# Patient Record
Sex: Female | Born: 1995
Health system: Southern US, Community
[De-identification: ages and names within clinical notes are randomized; demographics above are authoritative.]

## PROBLEM LIST (undated history)

## (undated) DIAGNOSIS — I10 Essential (primary) hypertension: Secondary | ICD-10-CM

## (undated) HISTORY — DX: Essential (primary) hypertension: I10

---
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
## 2018-11-11 IMAGING — CR DG FINGER THUMB 2+V*R*
3 series · 3 of 3 positions shown · non-contrast
Comparison: None.

CLINICAL DATA: Crush injury of the distal right thumb

EXAM:
RIGHT THUMB 2+V

[x finger pa right]
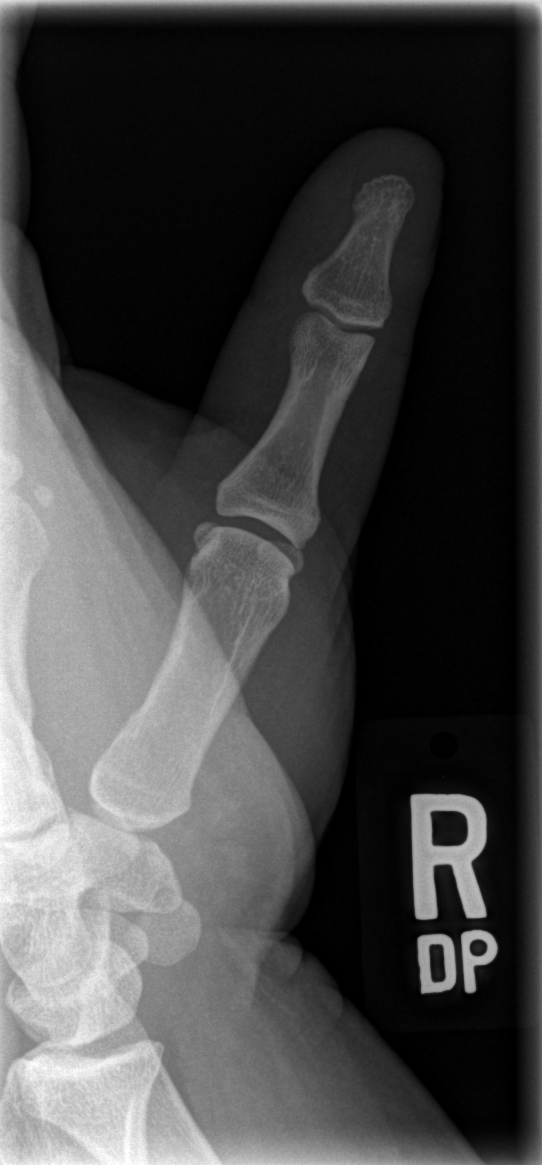

[x finger obl. right]
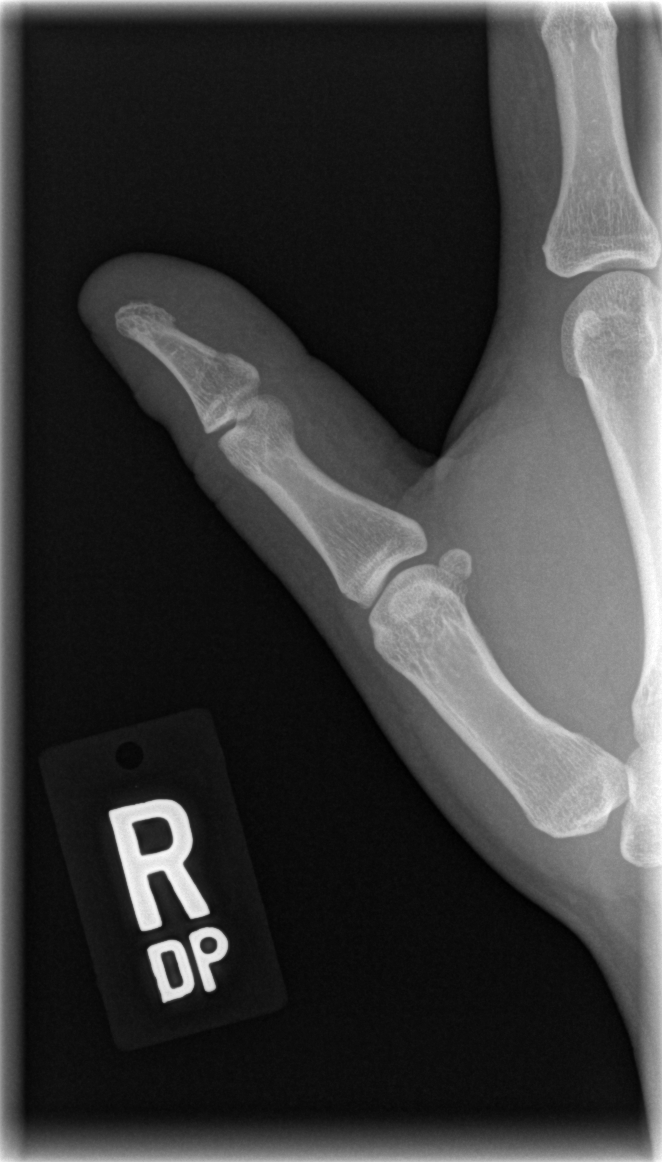

[x finger lateral right]
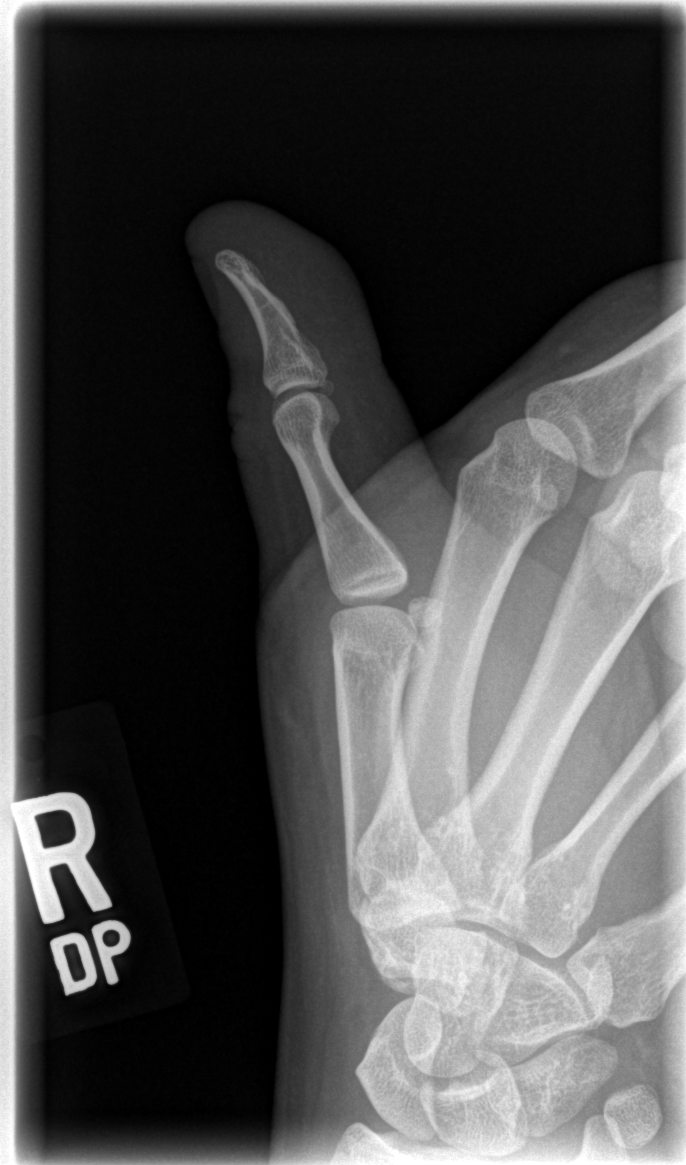

[3 of 3 positions shown; findings below may reference images not displayed]

FINDINGS: There is no evidence of fracture or dislocation. There is no
evidence of arthropathy or other focal bone abnormality. Soft
tissues are unremarkable
IMPRESSION: No acute osseous injury of the right thumb.

## 2022-04-03 ENCOUNTER — Ambulatory Visit (INDEPENDENT_AMBULATORY_CARE_PROVIDER_SITE_OTHER): Payer: BC Managed Care – PPO | Admitting: Obstetrics and Gynecology

## 2022-04-03 VITALS — BP 145/84 | HR 76 | Ht 67.0 in | Wt 336.5 lb

## 2022-04-03 DIAGNOSIS — R030 Elevated blood-pressure reading, without diagnosis of hypertension: Secondary | ICD-10-CM | POA: Diagnosis not present

## 2022-04-03 DIAGNOSIS — Z3401 Encounter for supervision of normal first pregnancy, first trimester: Secondary | ICD-10-CM

## 2022-04-03 DIAGNOSIS — N910 Primary amenorrhea: Secondary | ICD-10-CM | POA: Diagnosis not present

## 2022-04-03 DIAGNOSIS — Z6837 Body mass index (BMI) 37.0-37.9, adult: Secondary | ICD-10-CM

## 2022-04-03 DIAGNOSIS — Z3201 Encounter for pregnancy test, result positive: Secondary | ICD-10-CM | POA: Diagnosis not present

## 2022-04-03 DIAGNOSIS — Z3A01 Less than 8 weeks gestation of pregnancy: Secondary | ICD-10-CM

## 2022-04-03 DIAGNOSIS — N911 Secondary amenorrhea: Secondary | ICD-10-CM | POA: Diagnosis not present

## 2022-04-03 LAB — POCT PREGNANCY, URINE: Preg Test, Ur: POSITIVE — AB

## 2022-04-03 NOTE — Patient Instructions (Addendum)

## 2022-04-03 NOTE — Progress Notes (Signed)
   PRENATAL VISIT NOTE  Subjective:  Deborah [name] is a 26 y.o. G1 at [redacted]w[redacted]d by sure LMP 02/11/22 presenting for nurse visit for pregnancy test.   UPT positive. Pt present w/ her partner. They state they weren't actively trying for pregnancy but are accepting and doing well. During visit, pt had two elevated blood pressures. Asymptomatic. Denies ever being told she had a high blood pressure before.  Objective:   Vitals:   04/03/22 1338 04/03/22 1354  BP: (!) 157/97 (!) 145/84  Pulse: 72 76  Weight: 236 lb 8 oz (107.3 kg)   Height: 5\' 7"  (1.702 m)    General:  Alert, oriented and cooperative. Patient is in no acute distress.  Skin: Skin is warm and dry. No rash noted.   Cardiovascular: Normal heart rate noted  Respiratory: Normal respiratory effort, no problems with respiration noted   Assessment and Plan:  Pregnancy: G1P0 at [redacted]w[redacted]d 1. Supervision of low-risk first pregnancy, first trimester - CBC/D/Plt+RPR+Rh+ABO+RubIgG... - NOB intake scheduled 04/30/22  2. Elevated blood pressure reading - Comprehensive metabolic panel - Protein / creatinine ratio, urine - RTC in 1 week for BP check and to assess BP cuff size for home monitoring - Discussed w/ pt that if consistently >140/90, would recommend starting antihypertensive medications and closer monitoring during pregnancy  3. BMI 37.0-37.9, adult - Hemoglobin A1c  Future Appointments  Date Time Provider Department Center  04/10/2022  9:20 AM [facility] NURSE [facility]  04/30/2022  9:15 AM [facility] INTAKE [facility]    06/29/2022, MD

## 2022-04-03 NOTE — Progress Notes (Signed)
Pt here today for pregnancy test resulting positive.  Pt reports that her LMP 02/11/22, EDD 11/18/22, and 7w 2d today.  Pt denies vaginal bleeding and pain.  Pt desires to start prenatal care with [facility].  Pt presents with elevated BP.  Pt denies visual disturbances and headaches.

## 2022-04-04 ENCOUNTER — Encounter: Payer: Self-pay | Admitting: Obstetrics and Gynecology

## 2022-04-04 DIAGNOSIS — R7309 Other abnormal glucose: Secondary | ICD-10-CM | POA: Insufficient documentation

## 2022-04-04 DIAGNOSIS — R030 Elevated blood-pressure reading, without diagnosis of hypertension: Secondary | ICD-10-CM

## 2022-04-04 HISTORY — DX: Elevated blood-pressure reading, without diagnosis of hypertension: R03.0

## 2022-04-04 LAB — CBC/D/PLT+RPR+RH+ABO+RUBIGG...
Antibody Screen: NEGATIVE
Basophils Absolute: 0 10*3/uL (ref 0.0–0.2)
Basos: 0 %
EOS (ABSOLUTE): 0.1 10*3/uL (ref 0.0–0.4)
Eos: 1 %
HCV Ab: NONREACTIVE
HIV Screen 4th Generation wRfx: NONREACTIVE
Hematocrit: 42.9 % (ref 34.0–46.6)
Hemoglobin: 14.2 g/dL (ref 11.1–15.9)
Hepatitis B Surface Ag: NEGATIVE
Immature Grans (Abs): 0.1 10*3/uL (ref 0.0–0.1)
Immature Granulocytes: 1 %
Lymphocytes Absolute: 2.8 10*3/uL (ref 0.7–3.1)
Lymphs: 27 %
MCH: 27.8 pg (ref 26.6–33.0)
MCHC: 33.1 g/dL (ref 31.5–35.7)
MCV: 84 fL (ref 79–97)
Monocytes Absolute: 0.6 10*3/uL (ref 0.1–0.9)
Monocytes: 6 %
Neutrophils Absolute: 6.9 10*3/uL (ref 1.4–7.0)
Neutrophils: 65 %
Platelets: 420 10*3/uL (ref 150–450)
RBC: 5.11 x10E6/uL (ref 3.77–5.28)
RDW: 12.4 % (ref 11.7–15.4)
RPR Ser Ql: NONREACTIVE
Rh Factor: POSITIVE
Rubella Antibodies, IGG: 2.02 index (ref >0.99)
WBC: 10.5 10*3/uL (ref 3.4–10.8)

## 2022-04-04 LAB — COMPREHENSIVE METABOLIC PANEL
ALT: 22 IU/L (ref 0–32)
AST: 13 IU/L (ref 0–40)
Albumin/Globulin Ratio: 1.8 (ref 1.2–2.2)
Albumin: 4.6 g/dL (ref 4.0–5.0)
Alkaline Phosphatase: 120 IU/L (ref 44–121)
BUN/Creatinine Ratio: 13 (ref 9–23)
BUN: 9 mg/dL (ref 6–20)
Bilirubin Total: 0.2 mg/dL (ref 0.0–1.2)
CO2: 20 mmol/L (ref 20–29)
Calcium: 9.5 mg/dL (ref 8.7–10.2)
Chloride: 104 mmol/L (ref 96–106)
Creatinine, Ser: 0.7 mg/dL (ref 0.57–1.00)
Globulin, Total: 2.6 g/dL (ref 1.5–4.5)
Glucose: 130 mg/dL — ABNORMAL HIGH (ref 70–99)
Potassium: 3.9 mmol/L (ref 3.5–5.2)
Sodium: 140 mmol/L (ref 134–144)
Total Protein: 7.2 g/dL (ref 6.0–8.5)
eGFR: 124 mL/min/{1.73_m2} (ref >59)

## 2022-04-04 LAB — HEMOGLOBIN A1C
Est. average glucose Bld gHb Est-mCnc: 117 mg/dL
Hgb A1c MFr Bld: 5.7 % — ABNORMAL HIGH (ref 4.8–5.6)

## 2022-04-04 LAB — HCV INTERPRETATION

## 2022-04-05 LAB — PROTEIN / CREATININE RATIO, URINE
Creatinine, Urine: 192 mg/dL
Protein, Ur: 33.8 mg/dL
Protein/Creat Ratio: 176 mg/g creat (ref 0–200)

## 2022-04-07 ENCOUNTER — Encounter: Payer: Self-pay | Admitting: *Deleted

## 2022-04-10 ENCOUNTER — Ambulatory Visit (INDEPENDENT_AMBULATORY_CARE_PROVIDER_SITE_OTHER): Payer: BC Managed Care – PPO

## 2022-04-10 VITALS — BP 143/97 | HR 68 | Ht 67.0 in | Wt 324.1 lb

## 2022-04-10 DIAGNOSIS — Z0130 Encounter for examination of blood pressure without abnormal findings: Secondary | ICD-10-CM | POA: Diagnosis not present

## 2022-04-10 MED ORDER — NIFEDIPINE ER OSMOTIC RELEASE 30 MG PO TB24: 30.0000 mg | ORAL_TABLET | Freq: Every day | ORAL | 1 refills | Status: DC

## 2022-04-10 NOTE — Progress Notes (Signed)
Pt here today for BP check s/p elevated BP at nurse visit on 04/03/22.  Pt denies headache and changes in vision.  BP LA 141/97  Rpt RA 143/97.  Reviewed chart with Dr. Shawnie Pons who recommends that pt starts taking Procardia 30 mg tablet daily.  Pt advised to continue to monitor for sx's of elevated BP, that we will f/u with her at her appt at 04/30/22, and to reach out to the office with questions/concerns.  Procardia e-prescribed.  Pt verbalized understanding with no further questions.  Addison Naegeli, RN  04/10/22

## 2022-04-23 ENCOUNTER — Other Ambulatory Visit: Payer: Self-pay

## 2022-04-23 DIAGNOSIS — Z3401 Encounter for supervision of normal first pregnancy, first trimester: Secondary | ICD-10-CM

## 2022-04-28 DIAGNOSIS — R7303 Prediabetes: Secondary | ICD-10-CM

## 2022-04-28 HISTORY — DX: Prediabetes: R73.03

## 2022-04-28 NOTE — L&D Delivery Note (Signed)
Delivery Note 27 y.o. G1P0 at [redacted]w[redacted]d admitted for IOL due to cHTN, LGA, A1GDM.  At 1:01 PM a viable female was delivered via Vaginal, Spontaneous (Presentation: Left Occiput Anterior).  APGAR: 7, 9; weight  TBD.   Placenta status: Spontaneous, Intact.  Cord: 3 vessels with the following complications: None.  Cord pH: not collected  Anesthesia: Epidural Episiotomy: None Lacerations: None Suture Repair: 3-0 monocryl figure of 8 applied to vaginal abrasion to achieve hemostasis Est. Blood Loss (mL): 193  Mom to postpartum.  Baby to Couplet care / Skin to Skin.  Sheppard Evens MD MPH OB Fellow, Faculty Practice [facility], Center for [facility] 11/16/2022

## 2022-04-30 ENCOUNTER — Other Ambulatory Visit: Payer: Self-pay

## 2022-04-30 ENCOUNTER — Other Ambulatory Visit: Payer: Medicaid Other

## 2022-04-30 ENCOUNTER — Ambulatory Visit (INDEPENDENT_AMBULATORY_CARE_PROVIDER_SITE_OTHER): Payer: BC Managed Care – PPO

## 2022-04-30 VITALS — BP 134/85 | HR 79 | Wt 324.3 lb

## 2022-04-30 DIAGNOSIS — O0990 Supervision of high risk pregnancy, unspecified, unspecified trimester: Secondary | ICD-10-CM | POA: Insufficient documentation

## 2022-04-30 DIAGNOSIS — O10919 Unspecified pre-existing hypertension complicating pregnancy, unspecified trimester: Secondary | ICD-10-CM | POA: Insufficient documentation

## 2022-04-30 DIAGNOSIS — R7309 Other abnormal glucose: Secondary | ICD-10-CM

## 2022-04-30 DIAGNOSIS — Z3480 Encounter for supervision of other normal pregnancy, unspecified trimester: Secondary | ICD-10-CM

## 2022-04-30 DIAGNOSIS — I10 Essential (primary) hypertension: Secondary | ICD-10-CM

## 2022-04-30 DIAGNOSIS — Z3401 Encounter for supervision of normal first pregnancy, first trimester: Secondary | ICD-10-CM

## 2022-04-30 NOTE — Progress Notes (Signed)
Patient was assessed and managed by nursing staff during this encounter. I have reviewed the chart and agree with the documentation and plan. I have also made any necessary editorial changes.  Griffin Basil, MD 04/30/2022 1:12 PM

## 2022-04-30 NOTE — Addendum Note (Signed)
Addended by: Bethanne Ginger on: 04/30/2022 02:57 PM   Modules accepted: Orders

## 2022-04-30 NOTE — Patient Instructions (Signed)
AREA PEDIATRIC/FAMILY PRACTICE PHYSICIANS  [place] ([address]) [facility] Chambliss, MD; Eniola, MD; Hale, MD; Hensel, MD; McDiarmid, MD; McIntyer, MD; Berklee Battey, MD; Walden, MD [address] [place], [place] [address] [phone] Mon-Fri 8:30-12:30, 1:30-5:00 Providers come to see babies at [facility] Accepting Medicaid [facility] providers who accept newborns: Koirala, MD; Morrow, MD; Wolters, MD [address], [place], [place] [address] [phone] Mon-Fri 8:00-5:30 Babies seen by providers at [facility] Does NOT accept Medicaid Please call early in hospitalization for appointment (limited availability)  [facility] Mulberry, MD [address] [place], [place] [address] [phone] Mon, Tue, Thur, Fri 8:30-5:00, Wed 10:00-7:00 (closed 1-2pm) Babies seen by [facility] providers Accepting Medicaid Rubin - Pediatrician Rubin, MD [address], [place], [place] [address] [phone] Mon-Fri 8:30-5:00, Sat 8:30-12:00 Provider comes to see babies at [facility] Accepting Medicaid Must have been referred from current patients or contacted office prior to delivery [facility] ([facility]) Brown, MD; Chandler, MD; Ettefagh, MD; Grant, MD; Lester, MD; McCormick, MD; McQueen, MD; Prose, MD; Simha, MD; Stanley, MD; Stryffeler, NP; Tebben, NP [address], [place], [place] [address] [phone] Mon, Tue, Thur, Fri 8:30-5:30, Wed 9:30-5:30, Sat 8:30-12:30 Babies seen by [facility] providers Accepting Medicaid Only accepting infants of first-time parents or siblings of current patients Hospital discharge coordinator will make follow-up appointment Jack Amos [address], [place], [place]  [address] [phone]   Fax - [phone] [facility] [address], [place], [place]  [address] Phone - [phone]   Fax - [phone] Shilpa Gosrani [address], [place], [place]  [address] [phone]  [place] ([address]) [facility] Bates, MD; Brassfield, MD; Cooper, Cox, MD; MD; Davis, MD; Dovico, MD; Ettefaugh, MD; Little, MD; Lowe, MD; Keiffer, MD; Melvin, MD; Sumner, MD; Williams, MD [address], [place], [place] [address] [phone] Mon-Fri 8:30-5:00 (extended evenings Mon-Thur as needed), Sat-Sun 10:00-1:00 Providers come to see babies at [facility] Accepting Medicaid for families of first-time babies and families with all children in the household age 3 and under. Must register with office prior to making appointment (M-F only). [facility] Henson, NP; Knapp, MD; Lalonde, MD; Tysinger, PA [address] [place], [place] [address] [phone] Mon-Fri 8:00-5:00 Babies seen by providers at [facility] Does NOT accept Medicaid/Commercial Insurance Only [facility] ([facility])  Artis, MD; Barnes, MD; Bratton, MD; Coccaro, MD; Lockett Gardner, MD; Kramer, MD; Marshall, MD; Netherton, MD; Poleto, MD; Skinner, MD [address] [place], [place] [address] [phone] Mon-Fri 8:30-5:30, Sat (Oct.-Mar.) 9:00-1:00 Babies seen by providers at [facility] Accepting Medicaid  [place] ([address]) [facility] Reid, MD; Warner, MD [address], [place],  [address] [phone] Mon-Fri 8:30-5:00, Sat 8:30-12:00 Providers come to see babies at [facility] Does NOT accept Medicaid [facility] Becker, PA; Hagler, MD; Scifres, PA; Sun, MD; Swayne, MD [address], ,  [address] [phone] Mon-Fri 8:00-5:00 Babies seen by providers at [facility] Does NOT accept Medicaid Only accepting babies of parents who  are patients Please call early in hospitalization for appointment (limited availability)  Pediatricians Clark, MD; Frye, MD; Kelleher, MD; Mack, NP; Miller, MD; O'Keller, MD; [place], NP; Pudlo, MD; Puzio, MD; Thomas, MD; Tucker, MD; Twiselton, MD [address], [place], [place] [address] [phone] Mon-Fri 8:00-5:00, Sat 9:00-12:00 Providers come to see babies at [facility] Does NOT accept Medicaid  [place] ([address]) [facility] providers accepting new patients: Brake, NP; Wharton, PA [address], [place], [place] [address] [phone] Mon-Fri 8:00-5:00 Babies seen by providers at [facility] Does NOT accept Medicaid Only accepting babies of parents who are patients Please call early in hospitalization for appointment (limited availability) [facility] Gay, MD; Quinlan, MD [address] [place], [place] [address] [phone] (press [phone] to schedule appointment) Mon-Fri 8:00-5:00 Providers come to see babies at [facility] Does NOT accept Medicaid [facility] Mazer, MD [address] [place], [place] [address] [phone] Mon-Fri 8:30-5:00 (lunch 12:30-1:00), extended hours by appointment only Wed 5:00-6:30 Babies seen by [facility] providers Accepting Medicaid [facility] Banks, MD; Jordan, MD; Koberlein, MD [address], [place], [place] [address] [phone] Mon-Fri 8:00-5:00 Babies seen by [facility] providers Does NOT accept Medicaid [facility] Parker, MD; Hunter, MD; Wallace, DO [address] [place], [place] [address] [phone] Mon-Fri 8:00-5:00 Babies seen by [facility] providers Does NOT accept Medicaid [facility] Brandon, PA; Brecken, PA; Christy, NP; Dees, MD; DeClaire, MD; DeWeese, MD; Hansen, NP; Mills, NP; Parrish, NP; Smoot, NP; Summer, MD; Vapne,  MD [address] [place], [place] [address] [phone] Mon-Fri 8:30-5:00, Sat 10:00-1:00 Providers come to see babies at [facility] Does NOT accept Medicaid Free prenatal information session Tuesdays at 4:45pm [facility] Bouska, MD; Gordon, PA; Jeffery, PA; Weber, PA [address] [place] [address] [phone] Mon-Fri 7:30-5:30 Babies seen by [facility] providers [facility] [address], [place], [place]  [address] [phone]   Fax - [phone]  [place] ([address] & [address]) [facility] [place] [address] [place], [place] [address] [phone] Mon-Thur 8:00-6:00 Providers come to see babies at [facility] Accepting Medicaid [facility] Anderson, NP; Badger, MD; Beal, PA; Spencer, PA [address] [place], [place] [address] [phone] Mon-Thur 7:30-7:30, Fri 7:30-4:30 Babies seen by [facility] providers Accepting Medicaid [facility] Agbuya, MD; Klett, NP; Romgoolam, MD [address], [place], [place] [address] [phone] Mon-Fri 8:30-5:00, Sat 8:30-12:00 Providers come to see babies at [facility] Accepting Medicaid Must have "Meet & Greet" appointment at office prior to delivery [facility] ([facility] [place]) McCord, MD; Wallace, MD; Wood, MD [address], [place], [place] [address] [phone] Mon-Wed 8:00-6:00, Thur-Fri 8:00-5:00, Sat 9:00-12:00 Providers come to see babies at [facility] Does NOT accept Medicaid Only accepting siblings of current patients [facility]  [address], [place], [place]  [address] [phone]   Fax - [phone] [facility] [address], [place], [place]  [address] [phone]   Fax -  [phone]  [place] ([address] & [address]) [facility] Cirigliano, DO; Matthews, DO [address] [place], [place] [address] [phone] Mon-Fri 7:00-5:00 Babies seen by [facility] providers Does NOT accept Medicaid [facility] Briscoe, MD; Howley, PA; Moreira, PA [address], [place], [place] [address] [phone] Mon-Fri 8:00-5:00 Babies seen by [facility] providers Accepting Medicaid [facility] Boyd, MD; Church, PA; Jones, NP; Osborn, PA [address], , [place] [address] [phone] Mon-Fri 8:00-5:00 Babies seen by providers at [facility] Accepting Medicaid  [place] ([address]) [facility] [place] Wendling, DO [address] [place], [place] [address] [phone] Mon-Fri 8:00-5:00 Babies seen by [facility] providers Does NOT accept Medicaid Limited availability, please call early in hospitalization to schedule follow-up [facility] Calderon, PA; Cummings, MD; Dillard, MD; Martin, PA; Olson, MD; VanDeven, PA [address], [place], [place] [address] [phone] Mon-Fri 8:30-5:00, Sat 9:00-12:00 Babies seen by providers at [facility] Accepting Medicaid Please register online then schedule online or call office [email or web address] Hunter, NP; Kumar, MD; Martin Rogers, PA [address], [place], [place] [address] [phone] Mon-Fri 8:00-5:00 Babies seen by providers at [facility] Accepting Medicaid [facility] ([facility]) Jay, MD; Kristi Fleenor, NP; West, MD [address], [place], [place] [address] [phone] Mon-Fri 8:00-5:30, Sat&Sun by appointment (phones open at  8:30) Babies seen by [facility] providers Accepting Medicaid Must be a first-time baby or sibling of current patient [facility] [place]  [address], [place], [place]  [address] [phone]   Fax - [phone]  [place] ([address]) [facility] Brown, PA; Cowen, PA; Rice, MD; Helton, PA; Spry, MD [address] [place], [place] [address] [phone] Mon-Thur 8:00-7:00, Fri 8:00-5:00, Sat 8:00-12:00, Sun 9:00-12:00 Babies seen by [facility] providers Accepting Medicaid [facility] Coe-Goins, MD; Marshall, MD; Pierre-Louis, MD [address], [place], [place] [address] [phone] Mon-Thur 8:00-5:00 Babies seen by providers at [facility] Accepting Medicaid [facility] Bratton, MD; Coe-Goins, MD; Hayes, MD; Lewis, MD; List, MD; Lott, MD; Marshall, MD; Moran, MD; O'Adleigh Mcmasters, MD; Pierre-Louis, MD; Pitonzo, MD; Scholer, MD; Spangle, MD [address] [place], [place] [address] [phone] Mon-Fri 8:00-5:30, Sat (Oct.-Mar.) 9:00-1:00 Babies seen by providers at [facility] Accepting Medicaid Must fill out new patient packet, available online at [email or web address] [facility] ([facility]) Friddle, NP; Harris, NP; Kelly, NP; Logan, MD; Melvin, PA; Poth, MD; Ramadoss, MD; Stanton, NP [address], [place], [place] [address] [phone] Mon-Thur 8:00-5:30, Fri 8:00-5:00 Babies seen by providers at [facility] Accepting Medicaid  [place] ([address]) [facility] Dixon, PA; Plumwood, MD; Pickard, MD; Tapia, PA [address], [place], [place] [address] [phone] Mon-Fri 8:00-5:00 Babies seen by providers at [facility] Accepting Medicaid   [place] ([address]) [facility] [place] Masneri, DO; Meyers, MD; Nelson, PA [address], [place], [place] [address] [phone] Mon-Fri 8:00-5:00 Babies seen by providers at [facility] Does NOT accept Medicaid Limited appointment availability, please call early in hospitalization  [facility] [place] Kunedd, DO; McGowen, MD [address], [place], [place] [address] [phone] Mon-Fri 8:00-5:00 Babies seen by [facility] providers Does NOT accept Medicaid [facility] [place] Cameron, MD; MacDonald, MD; Michaels, PA; Nayak, MD [address] [place] [address], [place], [place] [address] [phone] Mon-Fri 8:00-5:00 After hours clinic ([address] [place],  [address]) [phone] Mon-Fri 5:00-8:00, Sat 12:00-6:00, Sun 10:00-4:00 Babies seen by [facility] providers Accepting Medicaid [facility] [place] [address], [place], [place]  [address] [phone]   Fax - [phone]  [place] ([address]) [facility] [place] [facility] Andy, MD [address], [place], [place] [address] [phone] Mon-Fri 8:00-5:00 Babies seen by [facility] providers Does NOT accept Medicaid [facility] [place] ([facility] [place]) Eksir, MD [address], [place], [place] [address] [phone] Mon-Thur 8:00-7:00, Fri 8:00-5:00, Sat 8:00-12:00 Babies seen by providers at [facility] Accepting Medicaid - but does not have vaccinations in office (must be received elsewhere) Limited availability, please call early in hospitalization  [place] ([address]) [facility]  Charlene Flemming, MD [address], [place] [address] [phone]  Fax [phone]  [facility]  Kimberly Newton, MD, Annamarie Streilein, PA, Carla Hampton, PA [address] [place], [place]  [address] [phone] [facility]  [address], [place], [place] [address] [phone] [address], [place], [place] [address] [phone] (West Office)  [facility] [address], [place], [place] [address] [phone] [facility] [address] [place], [place], [place] [address] [phone] [facility] [address], [place], [place] [address] [phone] [facility] [address], [place], [place] [address] [phone] [facility] [address], [place], [place] [address] [phone] [facility] [address], [place], [place] [address] [phone] [facility]  [address], [place], [place] [address] [phone] Dr. [facility] Little [address] [place] [address], , [place] [address] [phone] [facility] [address], [place], [place] [address] [phone] [facility] [address], ,  [address] [phone]  

## 2022-04-30 NOTE — Progress Notes (Signed)
New OB Intake  I connected withNAME@ on 04/30/22 at  9:15 AM EST by In Person Visit and verified that I am speaking with the correct person using two identifiers. Nurse is located at [facility] and pt is located at [facility].  I discussed the limitations, risks, security and privacy concerns of performing an evaluation and management service by telephone and the availability of in person appointments. I also discussed with the patient that there may be a patient responsible charge related to this service. The patient expressed understanding and agreed to proceed.  I explained I am completing New OB Intake today. We discussed EDD of 11/18/22 that is based on LMP of 02/11/22. Pt is G1/P0. I reviewed her allergies, medications, Medical/Surgical/OB history, and appropriate screenings. I informed her of [facility] services. [facility] information placed in AVS. Based on history, this is a high risk pregnancy.  Patient Active Problem List   Diagnosis Date Noted   Elevated blood pressure reading - suspected cHTN 04/04/2022   Elevated hemoglobin A1c 04/04/2022    Concerns addressed today  Delivery Plans Plans [facility] at [facility]. Patient given information for [facility] Healthy Baby website for more information about Women[facility] and [place]. Patient is not interested in water birth. Offered upcoming OB visit with CNM to discuss further.  MyChart/Babyscripts MyChart access verified. I explained pt will have some visits in office and some virtually. Babyscripts instructions given and order placed. Patient verifies receipt of registration text/e-mail. Account successfully created and app downloaded.  Blood Pressure Cuff/Weight Scale Pt has her own BP Cuff, Explained after first prenatal appt pt will check weekly and document in 60. Patient does have weight scale.  Anatomy US Explained first scheduled [place] will be around 19 weeks. Anatomy US scheduled for 06/24/22 at 0900. Pt notified to arrive at  [place].  Labs Discussed Johnsie Cancel genetic screening with patient. Would like both Panorama and Horizon drawn at new OB visit. Routine prenatal labs needed.  COVID Vaccine Patient has had COVID vaccine.   Is patient a CenteringPregnancy candidate?  Not a Candidate Declined due to  NA Not a candidate due to [facility], medication controlled If accepted,    Is patient a Mom+Baby Combined Care candidate?  Not a candidate   If accepted, Mom+Baby staff notified  Social Determinants of Health Food Insecurity: Patient denies food insecurity. WIC Referral: Patient is interested in referral to [facility].  Transportation: Patient denies transportation needs. Childcare: Discussed no children allowed at ultrasound appointments. Offered childcare services; patient declines childcare services at this time.  First visit review I reviewed new OB appt with patient. I explained they will have a provider visit that includes . Explained pt will be seen by Dr. Elgie Congo at first visit; encounter routed to appropriate provider. Explained that patient will be seen by pregnancy navigator following visit with provider.   Bethanne Ginger, CMA 04/30/2022  9:30 AM

## 2022-05-01 LAB — COMPREHENSIVE METABOLIC PANEL
ALT: 17 IU/L (ref 0–32)
AST: 9 IU/L (ref 0–40)
Albumin/Globulin Ratio: 1.9 (ref 1.2–2.2)
Albumin: 4.3 g/dL (ref 4.0–5.0)
Alkaline Phosphatase: 94 IU/L (ref 44–121)
BUN/Creatinine Ratio: 11 (ref 9–23)
BUN: 8 mg/dL (ref 6–20)
Bilirubin Total: <0.2 mg/dL (ref 0.0–1.2)
CO2: 20 mmol/L (ref 20–29)
Calcium: 9.6 mg/dL (ref 8.7–10.2)
Chloride: 102 mmol/L (ref 96–106)
Creatinine, Ser: 0.73 mg/dL (ref 0.57–1.00)
Globulin, Total: 2.3 g/dL (ref 1.5–4.5)
Glucose: 124 mg/dL — ABNORMAL HIGH (ref 70–99)
Potassium: 4.4 mmol/L (ref 3.5–5.2)
Sodium: 138 mmol/L (ref 134–144)
Total Protein: 6.6 g/dL (ref 6.0–8.5)
eGFR: 118 mL/min/{1.73_m2} (ref >59)

## 2022-05-01 LAB — GLUCOSE TOLERANCE, 2 HOURS W/ 1HR
Glucose, 1 hour: 180 mg/dL — ABNORMAL HIGH (ref 70–179)
Glucose, 2 hour: 120 mg/dL (ref 70–152)
Glucose, Fasting: 100 mg/dL — ABNORMAL HIGH (ref 70–91)

## 2022-05-01 LAB — TSH: TSH: 1.23 u[IU]/mL (ref 0.450–4.500)

## 2022-05-02 LAB — CULTURE, OB URINE

## 2022-05-02 LAB — URINE CULTURE, OB REFLEX

## 2022-05-02 MED ORDER — GLUCOSE BLOOD VI STRP: ORAL_STRIP | 12 refills | Status: DC

## 2022-05-02 MED ORDER — ACCU-CHEK SOFTCLIX LANCETS MISC: 12 refills | Status: DC

## 2022-05-02 MED ORDER — FREESTYLE LITE DEVI: 1.0000 | Freq: Once | 0 refills | Status: AC

## 2022-05-07 ENCOUNTER — Encounter: Payer: BC Managed Care – PPO | Admitting: Obstetrics and Gynecology

## 2022-05-08 ENCOUNTER — Telehealth: Payer: Self-pay

## 2022-05-08 ENCOUNTER — Other Ambulatory Visit: Payer: Self-pay

## 2022-05-08 DIAGNOSIS — R7309 Other abnormal glucose: Secondary | ICD-10-CM

## 2022-05-08 LAB — HORIZON CUSTOM: REPORT SUMMARY: NEGATIVE

## 2022-05-08 NOTE — Telephone Encounter (Signed)
[place], Babyscripts  P [facility]-Bab[facility]cripts Htn Md; P [facility] Clinical Pool Babyscripts Trigger Notification for [name], Deborah Trigger Severity: Elevated Blood Pressure Value: 149/94 Reading Date: 2022-05-07 Symptoms: No Symptoms  Called pt to follow up on Babyscripts alert. VM left. MyChart message sent.

## 2022-05-09 LAB — PANORAMA PRENATAL TEST FULL PANEL:PANORAMA TEST PLUS 5 ADDITIONAL MICRODELETIONS: FETAL FRACTION: 2.9

## 2022-05-13 ENCOUNTER — Encounter: Payer: BC Managed Care – PPO | Attending: Obstetrics and Gynecology | Admitting: Registered"

## 2022-05-13 ENCOUNTER — Other Ambulatory Visit: Payer: Self-pay

## 2022-05-13 ENCOUNTER — Ambulatory Visit (INDEPENDENT_AMBULATORY_CARE_PROVIDER_SITE_OTHER): Payer: BC Managed Care – PPO | Admitting: Registered"

## 2022-05-13 DIAGNOSIS — Z713 Dietary counseling and surveillance: Secondary | ICD-10-CM | POA: Insufficient documentation

## 2022-05-13 DIAGNOSIS — R7309 Other abnormal glucose: Secondary | ICD-10-CM

## 2022-05-13 NOTE — Progress Notes (Signed)
Patient was seen for elevated A1c (in pregnancy) on 05/13/22  Start time 1120 and End time 1215   Estimated due date: 11/18/22; [redacted]w[redacted]d Patient receives Ob care at [facility]  Clinical: Medications: reviewed Medical History: elevated A1c Labs: OGTT 100(H)-180(H)-120, A1c 5.7%   Dietary and Lifestyle History: Pt states she usually doesn't eat breakfast, but trying to for the pregnancy. Pt states she usually eats most meats, but during pregnancy has aversion to red meat and pork.   Physical Activity: not assessed Stress: not assessed Sleep: not assessed  24 hr Recall: not assessed  NUTRITION INTERVENTION  Nutrition education (E-1) on the following topics:   Initial Follow-up  [x]  []  Definition of Gestational Diabetes [x]  []  Why dietary management is important in controlling blood glucose [x]  []  Effects each nutrient has on blood glucose levels [x]  []  Simple carbohydrates vs complex carbohydrates [x]  []  Fluid intake [x]  []  Creating a balanced meal plan [x]  []  Carbohydrate counting  [x]  []  When to check blood glucose levels [x]  []  Proper blood glucose monitoring techniques [x]  []  Effect of stress and stress reduction techniques  [x]  []  Exercise effect on blood glucose levels, appropriate exercise during pregnancy [x]  []  Importance of limiting caffeine and abstaining from alcohol and smoking [x]  []  Medications used for blood sugar control during pregnancy [x]  []  Hypoglycemia and rule of 15 [x]  []  Postpartum self care  Patient has a meter prior to visit.   Patient instructed to monitor glucose levels: FBS: 60 - ? 95 mg/dL, 2 hour: ? 120 mg/dL  Patient received handouts: Nutrition Diabetes and Pregnancy Carbohydrate Counting List  Patient will be seen for follow-up as needed.

## 2022-05-19 ENCOUNTER — Other Ambulatory Visit: Payer: Self-pay | Admitting: Obstetrics and Gynecology

## 2022-05-19 ENCOUNTER — Other Ambulatory Visit: Payer: Self-pay

## 2022-05-19 ENCOUNTER — Ambulatory Visit (INDEPENDENT_AMBULATORY_CARE_PROVIDER_SITE_OTHER): Payer: BC Managed Care – PPO | Admitting: Obstetrics and Gynecology

## 2022-05-19 ENCOUNTER — Other Ambulatory Visit (HOSPITAL_COMMUNITY)
Admission: RE | Admit: 2022-05-19 | Discharge: 2022-05-19 | Disposition: A | Discharge status: Home / Self Care | Payer: BC Managed Care – PPO | Source: Ambulatory Visit | Attending: Obstetrics and Gynecology | Admitting: Obstetrics and Gynecology

## 2022-05-19 ENCOUNTER — Encounter: Payer: Self-pay | Admitting: Obstetrics and Gynecology

## 2022-05-19 VITALS — BP 137/87 | HR 82 | Wt 321.2 lb

## 2022-05-19 DIAGNOSIS — O0990 Supervision of high risk pregnancy, unspecified, unspecified trimester: Secondary | ICD-10-CM | POA: Insufficient documentation

## 2022-05-19 DIAGNOSIS — R7309 Other abnormal glucose: Secondary | ICD-10-CM

## 2022-05-19 DIAGNOSIS — I10 Essential (primary) hypertension: Secondary | ICD-10-CM | POA: Diagnosis not present

## 2022-05-19 DIAGNOSIS — Z3A13 13 weeks gestation of pregnancy: Secondary | ICD-10-CM

## 2022-05-19 DIAGNOSIS — O24410 Gestational diabetes mellitus in pregnancy, diet controlled: Secondary | ICD-10-CM | POA: Insufficient documentation

## 2022-05-19 DIAGNOSIS — O0991 Supervision of high risk pregnancy, unspecified, first trimester: Secondary | ICD-10-CM | POA: Diagnosis not present

## 2022-05-19 DIAGNOSIS — Z23 Encounter for immunization: Secondary | ICD-10-CM

## 2022-05-19 DIAGNOSIS — Z6841 Body Mass Index (BMI) 40.0 and over, adult: Secondary | ICD-10-CM

## 2022-05-19 DIAGNOSIS — O24419 Gestational diabetes mellitus in pregnancy, unspecified control: Secondary | ICD-10-CM | POA: Insufficient documentation

## 2022-05-19 DIAGNOSIS — R030 Elevated blood-pressure reading, without diagnosis of hypertension: Secondary | ICD-10-CM

## 2022-05-19 HISTORY — DX: Gestational diabetes mellitus in pregnancy, diet controlled: O24.410

## 2022-05-19 MED ORDER — NIFEDIPINE ER 60 MG PO TB24: 60.0000 mg | ORAL_TABLET | Freq: Every day | ORAL | 4 refills | Status: DC

## 2022-05-19 NOTE — Progress Notes (Signed)
INITIAL PRENATAL VISIT NOTE  Subjective:  Deborah [name] is a 27 y.o. G1P0 at [redacted]w[redacted]d by LMP being seen today for her initial prenatal visit. She has an obstetric history significant for nulliparity. She has a medical history significant for maternal obesity.  Patient reports no complaints.  Contractions: Irritability. Vag. Bleeding: None.  Movement: Absent. Denies leaking of fluid.    Past Medical History:  Diagnosis Date   Hypertension    Pre-diabetes 2024    Past Surgical History:  Procedure Laterality Date   MANDIBLE SURGERY Right 2018    OB History  Gravida Para Term Preterm AB Living  1            SAB IAB Ectopic Multiple Live Births               # Outcome Date GA Lbr Len/2nd Weight Sex Delivery Anes PTL Lv  1 Current             Social History   Socioeconomic History   Marital status: Single    Spouse name: Not on file   Number of children: Not on file   Years of education: Not on file   Highest education level: Not on file  Occupational History   Not on file  Tobacco Use   Smoking status: Never    Passive exposure: Never   Smokeless tobacco: Never  Vaping Use   Vaping Use: Never used  Substance and Sexual Activity   Alcohol use: Not Currently   Drug use: Never   Sexual activity: Yes    Birth control/protection: None  Other Topics Concern   Not on file  Social History Narrative   Not on file   Social Determinants of Health   Financial Resource Strain: Not on file  Food Insecurity: No Food Insecurity (05/19/2022)   Hunger Vital Sign    Worried About Running Out of Food in the Last Year: Never true    Ran Out of Food in the Last Year: Never true  Transportation Needs: No Transportation Needs (05/19/2022)   PRAPARE - Hydrologist (Medical): No    Lack of Transportation (Non-Medical): No  Physical Activity: Not on file  Stress: Not on file  Social Connections: Not on file    History reviewed. No pertinent  family history.   Current Outpatient Medications:    Accu-Chek Softclix Lancets lancets, Use as instructed. QID, Disp: 100 each, Rfl: 12   Blood Glucose Monitoring Suppl (ACCU-CHEK GUIDE ME) w/Device KIT, USE AS DIRECTED 4 TIMES A DAY, Disp: , Rfl:    glucose blood test strip, Use as instructed. QID, Disp: 100 each, Rfl: 12   NIFEdipine (PROCARDIA XL) 30 MG 24 hr tablet, Take 1 tablet (30 mg total) by mouth daily., Disp: 30 tablet, Rfl: 1   prenatal vitamin w/FE, FA (PRENATAL 1 + 1) 27-1 MG TABS tablet, Take 1 tablet by mouth daily at 12 noon., Disp: , Rfl:   No Known Allergies  Review of Systems: Negative except for what is mentioned in HPI.  Objective:   Vitals:   05/19/22 0936 05/19/22 0950  BP: (!) 138/90 137/87  Pulse: 79 82  Weight: (!) 321 lb 3.2 oz (145.7 kg)     Fetal Status: Fetal Heart Rate (bpm): 171   Movement: Absent     Physical Exam: BP 137/87   Pulse 82   Wt (!) 321 lb 3.2 oz (145.7 kg)   LMP 02/11/2022   BMI 50.31  kg/m  CONSTITUTIONAL: Well-developed, well-nourished female in no acute distress.  NEUROLOGIC: Alert and oriented to person, place, and time. Normal reflexes, muscle tone coordination. No cranial nerve deficit noted. PSYCHIATRIC: Normal mood and affect. Normal behavior. Normal judgment and thought content. SKIN: Skin is warm and dry. No rash noted. Not diaphoretic. No erythema. No pallor. HENT:  Normocephalic, atraumatic, External right and left ear normal. Oropharynx is clear and moist EYES: Conjunctivae and EOM are normal.  NECK: Normal range of motion, supple, no masses CARDIOVASCULAR: Normal heart rate noted, regular rhythm RESPIRATORY: Effort and breath sounds normal, no problems with respiration noted BREASTS:deferred ABDOMEN: Soft, nontender, obese, nondistended, gravid. GU: normal appearing external female genitalia, nulliparous normal appearing cervix, scant white discharge in vagina, no lesions noted, pap taken, swabs taken Bimanual:  12 weeks sized uterus, no adnexal tenderness or palpable lesions noted MUSCULOSKELETAL: Normal range of motion. EXT:  No edema and no tenderness. 2+ distal pulses.   Assessment and Plan:  Pregnancy: G1P0 at [redacted]w[redacted]d by LMP, dating scan   1. Hypertension, unspecified type BP is high normal on ,meds, procardia increased to 60 mg po daily  2. Supervision of high risk pregnancy, antepartum Continue routine prenatal care  3. [redacted] weeks gestation of pregnancy   4. Elevated hemoglobin A1c   5. Elevated blood pressure reading - suspected cHTN   6. BMI 50.0-59.9, adult ([place])   7. Diet controlled gestational diabetes mellitus (GDM) in first trimester FBS: 91-101 PPBS: 95-119   Preterm labor symptoms and general obstetric precautions including but not limited to vaginal bleeding, contractions, leaking of fluid and fetal movement were reviewed in detail with the patient.  Please refer to After Visit Summary for other counseling recommendations.   Return in about 3 weeks (around 06/09/2022) for [facility], in person.  Griffin Basil 05/19/2022 10:02 AM

## 2022-05-19 NOTE — Patient Instructions (Addendum)
Dating Ultrasound on Tuesday, February 13th, 2024 at 11AM.  Please arrive at 10:45AM with a full bladder.  Location: [facility]                [place], [place]  Phone: [phone]  Safe Medications in Pregnancy   Acne:  Benzoyl Peroxide  Salicylic Acid   Backache/Headache:  Tylenol: 2 regular strength every 4 hours OR               2 Extra strength every 6 hours   Colds/Coughs/Allergies:  Benadryl (alcohol free) 25 mg every 6 hours as needed  Breath right strips  Claritin  Cepacol throat lozenges  Chloraseptic throat spray  Cold-Eeze- up to three times per day  Cough drops, alcohol free  Flonase (by prescription only)  Guaifenesin  Mucinex  Robitussin DM (plain only, alcohol free)  Saline nasal spray/drops  Sudafed (pseudoephedrine) & Actifed * use only after [redacted] weeks gestation and if you do not have high blood pressure  Tylenol  Vicks Vaporub  Zinc lozenges  Zyrtec   Constipation:  Colace  Ducolax suppositories  Fleet enema  Glycerin suppositories  Metamucil  Milk of magnesia  Miralax  Senokot  Smooth move tea   Diarrhea:  Kaopectate  Imodium A-D   *NO pepto Bismol   Hemorrhoids:  Anusol  Anusol HC  Preparation H  Tucks   Indigestion:  Tums  Maalox  Mylanta  Zantac  Pepcid   Insomnia:  Benadryl (alcohol free) 25mg  every 6 hours as needed  Tylenol PM  Unisom, no Gelcaps   Leg Cramps:  Tums  MagGel   Nausea/Vomiting:  Bonine  Dramamine  Emetrol  Ginger extract  Sea bands  Meclizine  Nausea medication to take during pregnancy:  Unisom (doxylamine succinate 25 mg tablets) Take one tablet daily at bedtime. If symptoms are not adequately controlled, the dose can be increased to a maximum recommended dose of two tablets daily (1/2 tablet in the morning, 1/2 tablet mid-afternoon and one at bedtime).  Vitamin B6 100mg  tablets. Take one tablet twice a day (up to 200 mg per day).   Skin Rashes:   Aveeno products  Benadryl cream or 25mg  every 6 hours as needed  Calamine Lotion  1% cortisone cream   Yeast infection:  Gyne-lotrimin 7  Monistat 7    **If taking multiple medications, please check labels to avoid duplicating the same active ingredients  **take medication as directed on the label  ** Do not exceed 4000 mg of tylenol in 24 hours  **Do not take medications that contain aspirin or ibuprofen

## 2022-05-19 NOTE — Progress Notes (Signed)
Informal bedside ultrasound performed to assess FHR, FHR 172bpm 

## 2022-05-20 LAB — CERVICOVAGINAL ANCILLARY ONLY
Chlamydia: NEGATIVE
Comment: NEGATIVE
Comment: NEGATIVE
Comment: NORMAL
Neisseria Gonorrhea: NEGATIVE
Trichomonas: NEGATIVE

## 2022-05-22 LAB — CYTOLOGY - PAP: Diagnosis: NEGATIVE

## 2022-05-26 LAB — PANORAMA PRENATAL TEST FULL PANEL:PANORAMA TEST PLUS 5 ADDITIONAL MICRODELETIONS: FETAL FRACTION: 3.6

## 2022-06-09 ENCOUNTER — Other Ambulatory Visit: Payer: Self-pay

## 2022-06-09 ENCOUNTER — Ambulatory Visit (INDEPENDENT_AMBULATORY_CARE_PROVIDER_SITE_OTHER): Payer: BC Managed Care – PPO | Admitting: Family Medicine

## 2022-06-09 VITALS — BP 124/82 | HR 96 | Wt 320.2 lb

## 2022-06-09 DIAGNOSIS — O0992 Supervision of high risk pregnancy, unspecified, second trimester: Secondary | ICD-10-CM

## 2022-06-09 DIAGNOSIS — O24410 Gestational diabetes mellitus in pregnancy, diet controlled: Secondary | ICD-10-CM

## 2022-06-09 DIAGNOSIS — Z3A16 16 weeks gestation of pregnancy: Secondary | ICD-10-CM

## 2022-06-09 DIAGNOSIS — O10919 Unspecified pre-existing hypertension complicating pregnancy, unspecified trimester: Secondary | ICD-10-CM

## 2022-06-09 DIAGNOSIS — O0990 Supervision of high risk pregnancy, unspecified, unspecified trimester: Secondary | ICD-10-CM

## 2022-06-09 DIAGNOSIS — O10912 Unspecified pre-existing hypertension complicating pregnancy, second trimester: Secondary | ICD-10-CM

## 2022-06-09 DIAGNOSIS — I10 Essential (primary) hypertension: Secondary | ICD-10-CM

## 2022-06-09 NOTE — Progress Notes (Signed)
   PRENATAL VISIT NOTE  Subjective:  Deborah [name] is a 27 y.o. G1P0 at [redacted]w[redacted]d being seen today for ongoing prenatal care.  She is currently monitored for the following issues for this high-risk pregnancy and has Elevated blood pressure reading - suspected cHTN; Elevated hemoglobin A1c; Hypertension; Supervision of high risk pregnancy, antepartum; BMI 50.0-59.9, adult ([place]); Gestational diabetes; and Chronic hypertension affecting pregnancy on their problem list.  Patient reports no complaints.  Contractions: Not present. Vag. Bleeding: None.  Movement: Present. Denies leaking of fluid.   The following portions of the patient's history were reviewed and updated as appropriate: allergies, current medications, past family history, past medical history, past social history, past surgical history and problem list.   Objective:   Vitals:   06/09/22 1344  BP: 124/82  Pulse: 96  Weight: (!) 320 lb 3.2 oz (145.2 kg)    Fetal Status: Fetal Heart Rate (bpm): 150   Movement: Present     General:  Alert, oriented and cooperative. Patient is in no acute distress.  Skin: Skin is warm and dry. No rash noted.   Cardiovascular: Normal heart rate noted  Respiratory: Normal respiratory effort, no problems with respiration noted  Abdomen: Soft, gravid, appropriate for gestational age.  Pain/Pressure: Absent     Pelvic: Cervical exam deferred        Extremities: Normal range of motion.     Mental Status: Normal mood and affect. Normal behavior. Normal judgment and thought content.   Assessment and Plan:  Pregnancy: G1P0 at [redacted]w[redacted]d 1. Diet controlled gestational diabetes mellitus (GDM) in first trimester Fastings are 80-101 (higher for the first week of checking) and after have been low 80s since 2/4 PP are 92-119 and mostly in the low 100s  Q 4 week growth [place]  2. Supervision of high risk pregnancy, antepartum Bedside US showed FHR 150s, appears to b 12-13 weeks but no formal measurements  performed Up to date  Reviewed appt in 4 weeks  3. Hypertension, unspecified type BP well control  4. Chronic hypertension affecting pregnancy On Procardia, well controlled  Preterm labor symptoms and general obstetric precautions including but not limited to vaginal bleeding, contractions, leaking of fluid and fetal movement were reviewed in detail with the patient. Please refer to After Visit Summary for other counseling recommendations.   Return in about 4 weeks (around 07/07/2022) for Routine prenatal care, MD only.  Future Appointments  Date Time Provider [place]  06/10/2022 11:00 AM [facility] US1 [facility]  06/24/2022  8:45 AM [facility] NURSE [facility]  06/24/2022  9:00 AM [facility] US1 [facility]  06/30/2022  3:15 PM Griffin Basil, MD [facility]    Caren Macadam, MD

## 2022-06-10 ENCOUNTER — Ambulatory Visit
Admission: RE | Admit: 2022-06-10 | Discharge: 2022-06-10 | Disposition: A | Discharge status: Home / Self Care | Payer: BC Managed Care – PPO | Source: Ambulatory Visit | Attending: Obstetrics and Gynecology | Admitting: Obstetrics and Gynecology

## 2022-06-10 ENCOUNTER — Encounter: Payer: Self-pay | Admitting: General Practice

## 2022-06-10 ENCOUNTER — Other Ambulatory Visit: Payer: Self-pay | Admitting: Obstetrics and Gynecology

## 2022-06-10 ENCOUNTER — Ambulatory Visit (HOSPITAL_BASED_OUTPATIENT_CLINIC_OR_DEPARTMENT_OTHER): Payer: BC Managed Care – PPO

## 2022-06-10 DIAGNOSIS — E669 Obesity, unspecified: Secondary | ICD-10-CM | POA: Diagnosis not present

## 2022-06-10 DIAGNOSIS — O10012 Pre-existing essential hypertension complicating pregnancy, second trimester: Secondary | ICD-10-CM | POA: Diagnosis not present

## 2022-06-10 DIAGNOSIS — O0990 Supervision of high risk pregnancy, unspecified, unspecified trimester: Secondary | ICD-10-CM

## 2022-06-10 DIAGNOSIS — Z3A14 14 weeks gestation of pregnancy: Secondary | ICD-10-CM

## 2022-06-10 DIAGNOSIS — O24410 Gestational diabetes mellitus in pregnancy, diet controlled: Secondary | ICD-10-CM

## 2022-06-10 DIAGNOSIS — O99212 Obesity complicating pregnancy, second trimester: Secondary | ICD-10-CM | POA: Diagnosis not present

## 2022-06-24 ENCOUNTER — Ambulatory Visit: Payer: BC Managed Care – PPO

## 2022-06-24 ENCOUNTER — Other Ambulatory Visit: Payer: BC Managed Care – PPO

## 2022-06-30 ENCOUNTER — Other Ambulatory Visit: Payer: Self-pay

## 2022-06-30 ENCOUNTER — Ambulatory Visit (INDEPENDENT_AMBULATORY_CARE_PROVIDER_SITE_OTHER): Payer: BC Managed Care – PPO | Admitting: Obstetrics and Gynecology

## 2022-06-30 VITALS — BP 117/82 | HR 83 | Wt 317.1 lb

## 2022-06-30 DIAGNOSIS — O10912 Unspecified pre-existing hypertension complicating pregnancy, second trimester: Secondary | ICD-10-CM

## 2022-06-30 DIAGNOSIS — O0990 Supervision of high risk pregnancy, unspecified, unspecified trimester: Secondary | ICD-10-CM

## 2022-06-30 DIAGNOSIS — Z6841 Body Mass Index (BMI) 40.0 and over, adult: Secondary | ICD-10-CM

## 2022-06-30 DIAGNOSIS — O24410 Gestational diabetes mellitus in pregnancy, diet controlled: Secondary | ICD-10-CM

## 2022-06-30 DIAGNOSIS — O0992 Supervision of high risk pregnancy, unspecified, second trimester: Secondary | ICD-10-CM

## 2022-06-30 DIAGNOSIS — Z3A17 17 weeks gestation of pregnancy: Secondary | ICD-10-CM

## 2022-06-30 DIAGNOSIS — O10919 Unspecified pre-existing hypertension complicating pregnancy, unspecified trimester: Secondary | ICD-10-CM

## 2022-06-30 MED ORDER — ASPIRIN 81 MG PO CHEW: 81.0000 mg | CHEWABLE_TABLET | Freq: Every day | ORAL | 6 refills | Status: DC

## 2022-06-30 NOTE — Progress Notes (Signed)
   PRENATAL VISIT NOTE  Subjective:  Deborah [name] is a 27 y.o. G1P0 at 66w3dbeing seen today for ongoing prenatal care.  She is currently monitored for the following issues for this high-risk pregnancy and has Elevated blood pressure reading - suspected cHTN; Elevated hemoglobin A1c; Hypertension; Supervision of high risk pregnancy, antepartum; BMI 50.0-59.9, adult ([place]; Gestational diabetes; and Chronic hypertension affecting pregnancy on their problem list.  Patient doing well with no acute concerns today. She reports no complaints.  Contractions: Not present. Vag. Bleeding: None.  Movement: Present. Denies leaking of fluid.   The following portions of the patient's history were reviewed and updated as appropriate: allergies, current medications, past family history, past medical history, past social history, past surgical history and problem list. Problem list updated.  Objective:   Vitals:   06/30/22 1523  BP: 117/82  Pulse: 83  Weight: (!) 317 lb 1.6 oz (143.8 kg)    Fetal Status: Fetal Heart Rate (bpm): 158   Movement: Present     General:  Alert, oriented and cooperative. Patient is in no acute distress.  Skin: Skin is warm and dry. No rash noted.   Cardiovascular: Normal heart rate noted  Respiratory: Normal respiratory effort, no problems with respiration noted  Abdomen: Soft, gravid, appropriate for gestational age.  Pain/Pressure: Absent     Pelvic: Cervical exam deferred        Extremities: Normal range of motion.  Edema: None  Mental Status:  Normal mood and affect. Normal behavior. Normal judgment and thought content.   Assessment and Plan:  Pregnancy: G1P0 at 171w3d1. Supervision of high risk pregnancy, antepartum Continue routine prenatal care  2. [redacted] weeks gestation of pregnancy   3. Chronic hypertension affecting pregnancy Baby ASA added to meds Good control with current dose of nifedipine  4. Diet controlled gestational diabetes mellitus (GDM)  in second trimester FBS: 83-98 PPBS: 98-120, 80% > in range Excellent control at this time, will follow  5. BMI 50.0-59.9, adult ([place]  Preterm labor symptoms and general obstetric precautions including but not limited to vaginal bleeding, contractions, leaking of fluid and fetal movement were reviewed in detail with the patient.  Please refer to After Visit Summary for other counseling recommendations.   Return in about 4 weeks (around 07/28/2022) for in person.   LaLynnda ShieldsMD Faculty Attending Center for [facility]

## 2022-07-02 LAB — AFP, SERUM, OPEN SPINA BIFIDA
AFP MoM: 0.69
AFP Value: 17.5 ng/mL
Gest. Age on Collection Date: 17 weeks
Maternal Age At EDD: 25.2 yr
OSBR Risk 1 IN: 10000
Test Results:: NEGATIVE
Weight: 317 [lb_av]

## 2022-07-15 ENCOUNTER — Other Ambulatory Visit: Payer: Self-pay | Admitting: *Deleted

## 2022-07-15 ENCOUNTER — Encounter: Payer: Self-pay | Admitting: *Deleted

## 2022-07-15 ENCOUNTER — Ambulatory Visit: Payer: BC Managed Care – PPO | Attending: Obstetrics & Gynecology

## 2022-07-15 ENCOUNTER — Ambulatory Visit: Payer: BC Managed Care – PPO | Admitting: *Deleted

## 2022-07-15 VITALS — BP 128/66 | HR 70

## 2022-07-15 DIAGNOSIS — O99210 Obesity complicating pregnancy, unspecified trimester: Secondary | ICD-10-CM

## 2022-07-15 DIAGNOSIS — O10919 Unspecified pre-existing hypertension complicating pregnancy, unspecified trimester: Secondary | ICD-10-CM | POA: Insufficient documentation

## 2022-07-15 DIAGNOSIS — O10012 Pre-existing essential hypertension complicating pregnancy, second trimester: Secondary | ICD-10-CM | POA: Diagnosis not present

## 2022-07-15 DIAGNOSIS — O0990 Supervision of high risk pregnancy, unspecified, unspecified trimester: Secondary | ICD-10-CM | POA: Insufficient documentation

## 2022-07-15 DIAGNOSIS — O24410 Gestational diabetes mellitus in pregnancy, diet controlled: Secondary | ICD-10-CM | POA: Diagnosis not present

## 2022-07-15 DIAGNOSIS — Z3A19 19 weeks gestation of pregnancy: Secondary | ICD-10-CM

## 2022-07-15 DIAGNOSIS — Z362 Encounter for other antenatal screening follow-up: Secondary | ICD-10-CM

## 2022-07-15 DIAGNOSIS — O99212 Obesity complicating pregnancy, second trimester: Secondary | ICD-10-CM

## 2022-07-28 ENCOUNTER — Ambulatory Visit (INDEPENDENT_AMBULATORY_CARE_PROVIDER_SITE_OTHER): Payer: BC Managed Care – PPO | Admitting: Obstetrics & Gynecology

## 2022-07-28 ENCOUNTER — Other Ambulatory Visit: Payer: Self-pay

## 2022-07-28 VITALS — BP 137/82 | HR 106 | Wt 320.0 lb

## 2022-07-28 DIAGNOSIS — I10 Essential (primary) hypertension: Secondary | ICD-10-CM

## 2022-07-28 DIAGNOSIS — O0992 Supervision of high risk pregnancy, unspecified, second trimester: Secondary | ICD-10-CM

## 2022-07-28 DIAGNOSIS — O10919 Unspecified pre-existing hypertension complicating pregnancy, unspecified trimester: Secondary | ICD-10-CM

## 2022-07-28 DIAGNOSIS — O24410 Gestational diabetes mellitus in pregnancy, diet controlled: Secondary | ICD-10-CM

## 2022-07-28 DIAGNOSIS — O0990 Supervision of high risk pregnancy, unspecified, unspecified trimester: Secondary | ICD-10-CM

## 2022-07-28 DIAGNOSIS — Z3A21 21 weeks gestation of pregnancy: Secondary | ICD-10-CM

## 2022-07-28 NOTE — Progress Notes (Signed)
   PRENATAL VISIT NOTE  Subjective:  Deborah [name] is a 27 y.o. G1P0 at [redacted]w[redacted]d being seen today for ongoing prenatal care.  She is currently monitored for the following issues for this high-risk pregnancy and has Elevated blood pressure reading - suspected cHTN; Elevated hemoglobin A1c; Hypertension; Supervision of high risk pregnancy, antepartum; BMI 50.0-59.9, adult; Gestational diabetes; and Chronic hypertension affecting pregnancy on their problem list.  Patient reports  occasional numbness right lateral thigh, no back pain or weakness .  Contractions: Not present. Vag. Bleeding: None.  Movement: Present. Denies leaking of fluid.   The following portions of the patient's history were reviewed and updated as appropriate: allergies, current medications, past family history, past medical history, past social history, past surgical history and problem list.   Objective:   Vitals:   07/28/22 1308  BP: 137/82  Pulse: (!) 106  Weight: (!) 320 lb (145.2 kg)    Fetal Status: Fetal Heart Rate (bpm): 159   Movement: Present     General:  Alert, oriented and cooperative. Patient is in no acute distress.  Skin: Skin is warm and dry. No rash noted.   Cardiovascular: Normal heart rate noted  Respiratory: Normal respiratory effort, no problems with respiration noted  Abdomen: Soft, gravid, appropriate for gestational age.  Pain/Pressure: Absent     Pelvic: Cervical exam deferred        Extremities: Normal range of motion.  Edema: None  Mental Status: Normal mood and affect. Normal behavior. Normal judgment and thought content.   Assessment and Plan:  Pregnancy: G1P0 at [redacted]w[redacted]d 1. Supervision of high risk pregnancy, antepartum Normal anatomy scan  2. Chronic hypertension affecting pregnancy Continue Nifedipine, control is adequate  3. Hypertension, unspecified type   4. Diet controlled gestational diabetes mellitus (GDM) in second trimester All values in range  Preterm labor  symptoms and general obstetric precautions including but not limited to vaginal bleeding, contractions, leaking of fluid and fetal movement were reviewed in detail with the patient. Please refer to After Visit Summary for other counseling recommendations.   Return in about 4 weeks (around 08/25/2022).  Future Appointments  Date Time Provider [place]  08/19/2022 12:30 PM [facility] NURSE [facility]  08/19/2022 12:45 PM [facility] US5 [facility] [place]    Emeterio Reeve, MD

## 2022-08-15 ENCOUNTER — Encounter: Payer: Self-pay | Admitting: *Deleted

## 2022-08-19 ENCOUNTER — Ambulatory Visit: Payer: BC Managed Care – PPO

## 2022-08-19 ENCOUNTER — Ambulatory Visit: Payer: BC Managed Care – PPO | Attending: Maternal & Fetal Medicine

## 2022-08-19 ENCOUNTER — Other Ambulatory Visit: Payer: Self-pay

## 2022-08-19 VITALS — BP 135/82 | HR 101

## 2022-08-19 DIAGNOSIS — E669 Obesity, unspecified: Secondary | ICD-10-CM | POA: Diagnosis not present

## 2022-08-19 DIAGNOSIS — Z362 Encounter for other antenatal screening follow-up: Secondary | ICD-10-CM | POA: Diagnosis present

## 2022-08-19 DIAGNOSIS — O0990 Supervision of high risk pregnancy, unspecified, unspecified trimester: Secondary | ICD-10-CM | POA: Diagnosis present

## 2022-08-19 DIAGNOSIS — I10 Essential (primary) hypertension: Secondary | ICD-10-CM | POA: Diagnosis present

## 2022-08-19 DIAGNOSIS — O99212 Obesity complicating pregnancy, second trimester: Secondary | ICD-10-CM | POA: Diagnosis not present

## 2022-08-19 DIAGNOSIS — O10919 Unspecified pre-existing hypertension complicating pregnancy, unspecified trimester: Secondary | ICD-10-CM

## 2022-08-19 DIAGNOSIS — O10012 Pre-existing essential hypertension complicating pregnancy, second trimester: Secondary | ICD-10-CM

## 2022-08-19 DIAGNOSIS — Z3A24 24 weeks gestation of pregnancy: Secondary | ICD-10-CM

## 2022-08-19 DIAGNOSIS — O24410 Gestational diabetes mellitus in pregnancy, diet controlled: Secondary | ICD-10-CM | POA: Insufficient documentation

## 2022-08-19 DIAGNOSIS — O99210 Obesity complicating pregnancy, unspecified trimester: Secondary | ICD-10-CM | POA: Diagnosis present

## 2022-08-25 ENCOUNTER — Other Ambulatory Visit: Payer: Self-pay

## 2022-08-25 ENCOUNTER — Ambulatory Visit (INDEPENDENT_AMBULATORY_CARE_PROVIDER_SITE_OTHER): Payer: BC Managed Care – PPO | Admitting: Obstetrics and Gynecology

## 2022-08-25 VITALS — BP 124/81 | HR 89 | Wt 319.0 lb

## 2022-08-25 DIAGNOSIS — O0992 Supervision of high risk pregnancy, unspecified, second trimester: Secondary | ICD-10-CM

## 2022-08-25 DIAGNOSIS — O10912 Unspecified pre-existing hypertension complicating pregnancy, second trimester: Secondary | ICD-10-CM

## 2022-08-25 DIAGNOSIS — Z6841 Body Mass Index (BMI) 40.0 and over, adult: Secondary | ICD-10-CM

## 2022-08-25 DIAGNOSIS — Z3A25 25 weeks gestation of pregnancy: Secondary | ICD-10-CM

## 2022-08-25 DIAGNOSIS — O10919 Unspecified pre-existing hypertension complicating pregnancy, unspecified trimester: Secondary | ICD-10-CM

## 2022-08-25 DIAGNOSIS — O0990 Supervision of high risk pregnancy, unspecified, unspecified trimester: Secondary | ICD-10-CM

## 2022-08-25 DIAGNOSIS — O24410 Gestational diabetes mellitus in pregnancy, diet controlled: Secondary | ICD-10-CM

## 2022-08-25 NOTE — Progress Notes (Signed)
   PRENATAL VISIT NOTE  Subjective:  Deborah [name] is a 27 y.o. G1P0 at [redacted]w[redacted]d being seen today for ongoing prenatal care.  She is currently monitored for the following issues for this high-risk pregnancy and has Elevated hemoglobin A1c; Hypertension; Supervision of high risk pregnancy, antepartum; BMI 50.0-59.9, adult ([facility]); Gestational diabetes; and Chronic hypertension affecting pregnancy on their problem list.  Patient doing well with no acute concerns today. She reports no complaints.  Contractions: Not present. Vag. Bleeding: None.  Movement: Present. Denies leaking of fluid.   The following portions of the patient's history were reviewed and updated as appropriate: allergies, current medications, past family history, past medical history, past social history, past surgical history and problem list. Problem list updated.  Objective:   Vitals:   08/25/22 1130  BP: 124/81  Pulse: 89  Weight: (!) 319 lb (144.7 kg)    Fetal Status: Fetal Heart Rate (bpm): 161 Fundal Height: 26 cm Movement: Present     General:  Alert, oriented and cooperative. Patient is in no acute distress.  Skin: Skin is warm and dry. No rash noted.   Cardiovascular: Normal heart rate noted  Respiratory: Normal respiratory effort, no problems with respiration noted  Abdomen: Soft, gravid, appropriate for gestational age.  Pain/Pressure: Absent     Pelvic: Cervical exam deferred        Extremities: Normal range of motion.  Edema: None  Mental Status:  Normal mood and affect. Normal behavior. Normal judgment and thought content.   Assessment and Plan:  Pregnancy: G1P0 at [redacted]w[redacted]d  1. [redacted] weeks gestation of pregnancy   2. Chronic hypertension affecting pregnancy BP with good control currently  3. Diet controlled gestational diabetes mellitus (GDM) in second trimester FBS: 85-95 PPBS: 98-118  Excellent blood sugar control, no new modifications  4. Supervision of high risk pregnancy,  antepartum Continue routine prenatal care  5. BMI 50.0-59.9, adult ([facility])   Preterm labor symptoms and general obstetric precautions including but not limited to vaginal bleeding, contractions, leaking of fluid and fetal movement were reviewed in detail with the patient.  Please refer to After Visit Summary for other counseling recommendations.   Return in about 3 weeks (around 09/15/2022) for [facility], in person, 3rd trim labs.   Mariel Aloe, MD Faculty Attending Center for [facility]

## 2022-09-15 ENCOUNTER — Encounter: Payer: Self-pay | Admitting: Family Medicine

## 2022-09-15 ENCOUNTER — Other Ambulatory Visit: Payer: Self-pay

## 2022-09-15 ENCOUNTER — Ambulatory Visit (INDEPENDENT_AMBULATORY_CARE_PROVIDER_SITE_OTHER): Payer: BC Managed Care – PPO | Admitting: Family Medicine

## 2022-09-15 ENCOUNTER — Other Ambulatory Visit: Payer: BC Managed Care – PPO

## 2022-09-15 VITALS — BP 139/86 | HR 92 | Wt 323.0 lb

## 2022-09-15 DIAGNOSIS — O10913 Unspecified pre-existing hypertension complicating pregnancy, third trimester: Secondary | ICD-10-CM | POA: Diagnosis not present

## 2022-09-15 DIAGNOSIS — Z23 Encounter for immunization: Secondary | ICD-10-CM

## 2022-09-15 DIAGNOSIS — O0993 Supervision of high risk pregnancy, unspecified, third trimester: Secondary | ICD-10-CM

## 2022-09-15 DIAGNOSIS — Z3A28 28 weeks gestation of pregnancy: Secondary | ICD-10-CM

## 2022-09-15 DIAGNOSIS — O24410 Gestational diabetes mellitus in pregnancy, diet controlled: Secondary | ICD-10-CM

## 2022-09-15 DIAGNOSIS — O10919 Unspecified pre-existing hypertension complicating pregnancy, unspecified trimester: Secondary | ICD-10-CM

## 2022-09-15 DIAGNOSIS — O0990 Supervision of high risk pregnancy, unspecified, unspecified trimester: Secondary | ICD-10-CM

## 2022-09-15 NOTE — Progress Notes (Signed)
   PRENATAL VISIT NOTE  Subjective:  Deborah [name] is a 27 y.o. G1P0 at [redacted]w[redacted]d being seen today for ongoing prenatal care.  She is currently monitored for the following issues for this high-risk pregnancy and has Elevated hemoglobin A1c; Hypertension; Supervision of high risk pregnancy, antepartum; BMI 50.0-59.9, adult ([facility]); Gestational diabetes; and Chronic hypertension affecting pregnancy on their problem list.  Patient reports no complaints.  Contractions: Not present. Vag. Bleeding: None.  Movement: Present. Denies leaking of fluid.   The following portions of the patient's history were reviewed and updated as appropriate: allergies, current medications, past family history, past medical history, past social history, past surgical history and problem list.   Objective:   Vitals:   09/15/22 0851  BP: 139/86  Pulse: 92  Weight: (!) 323 lb (146.5 kg)    Fetal Status: Fetal Heart Rate (bpm): 153 Fundal Height: 32 cm Movement: Present     General:  Alert, oriented and cooperative. Patient is in no acute distress.  Skin: Skin is warm and dry. No rash noted.   Cardiovascular: Normal heart rate noted  Respiratory: Normal respiratory effort, no problems with respiration noted  Abdomen: Soft, gravid, appropriate for gestational age.  Pain/Pressure: Absent     Pelvic: Cervical exam deferred        Extremities: Normal range of motion.  Edema: None  Mental Status: Normal mood and affect. Normal behavior. Normal judgment and thought content.   Assessment and Plan:  Pregnancy: G1P0 at [redacted]w[redacted]d 1. Chronic hypertension affecting pregnancy BP is at goal On Procardia and ASA  2. Diet controlled gestational diabetes mellitus (GDM) in third trimester Log reviewed and all are in goal Check A1C--did not bring meter for review today  3. Supervision of high risk pregnancy, antepartum 28 week labs today - Tdap vaccine greater than or equal to 7yo IM  Preterm labor symptoms and general  obstetric precautions including but not limited to vaginal bleeding, contractions, leaking of fluid and fetal movement were reviewed in detail with the patient. Please refer to After Visit Summary for other counseling recommendations.   Return in 2 weeks (on 09/29/2022) for [facility].  Future Appointments  Date Time Provider Department Center  09/19/2022  7:30 AM [facility] NURSE [facility]  09/19/2022  7:45 AM [facility] US5 [facility]    Reva Bores, MD

## 2022-09-15 NOTE — Patient Instructions (Signed)
[email or web address] 

## 2022-09-16 LAB — CBC
Hematocrit: 36.8 % (ref 34.0–46.6)
Hemoglobin: 12 g/dL (ref 11.1–15.9)
MCH: 27.9 pg (ref 26.6–33.0)
MCHC: 32.6 g/dL (ref 31.5–35.7)
MCV: 86 fL (ref 79–97)
Platelets: 327 10*3/uL (ref 150–450)
RBC: 4.3 x10E6/uL (ref 3.77–5.28)
RDW: 12.4 % (ref 11.7–15.4)
WBC: 9.7 10*3/uL (ref 3.4–10.8)

## 2022-09-16 LAB — RPR: RPR Ser Ql: NONREACTIVE

## 2022-09-16 LAB — HIV ANTIBODY (ROUTINE TESTING W REFLEX): HIV Screen 4th Generation wRfx: NONREACTIVE

## 2022-09-19 ENCOUNTER — Other Ambulatory Visit: Payer: Self-pay | Admitting: *Deleted

## 2022-09-19 ENCOUNTER — Ambulatory Visit: Payer: BC Managed Care – PPO | Admitting: *Deleted

## 2022-09-19 ENCOUNTER — Ambulatory Visit: Payer: BC Managed Care – PPO

## 2022-09-19 ENCOUNTER — Ambulatory Visit: Payer: BC Managed Care – PPO | Attending: Obstetrics

## 2022-09-19 VITALS — BP 139/70 | HR 78

## 2022-09-19 DIAGNOSIS — O0990 Supervision of high risk pregnancy, unspecified, unspecified trimester: Secondary | ICD-10-CM

## 2022-09-19 DIAGNOSIS — O24410 Gestational diabetes mellitus in pregnancy, diet controlled: Secondary | ICD-10-CM | POA: Insufficient documentation

## 2022-09-19 DIAGNOSIS — E669 Obesity, unspecified: Secondary | ICD-10-CM

## 2022-09-19 DIAGNOSIS — O10013 Pre-existing essential hypertension complicating pregnancy, third trimester: Secondary | ICD-10-CM

## 2022-09-19 DIAGNOSIS — O10919 Unspecified pre-existing hypertension complicating pregnancy, unspecified trimester: Secondary | ICD-10-CM

## 2022-09-19 DIAGNOSIS — Z3A29 29 weeks gestation of pregnancy: Secondary | ICD-10-CM

## 2022-09-19 DIAGNOSIS — I10 Essential (primary) hypertension: Secondary | ICD-10-CM | POA: Diagnosis present

## 2022-09-19 DIAGNOSIS — O99213 Obesity complicating pregnancy, third trimester: Secondary | ICD-10-CM | POA: Diagnosis not present

## 2022-09-19 DIAGNOSIS — O99212 Obesity complicating pregnancy, second trimester: Secondary | ICD-10-CM | POA: Insufficient documentation

## 2022-09-19 DIAGNOSIS — O10913 Unspecified pre-existing hypertension complicating pregnancy, third trimester: Secondary | ICD-10-CM

## 2022-09-19 DIAGNOSIS — O24419 Gestational diabetes mellitus in pregnancy, unspecified control: Secondary | ICD-10-CM

## 2022-09-30 ENCOUNTER — Ambulatory Visit (INDEPENDENT_AMBULATORY_CARE_PROVIDER_SITE_OTHER): Payer: BC Managed Care – PPO | Admitting: Advanced Practice Midwife

## 2022-09-30 ENCOUNTER — Other Ambulatory Visit: Payer: Self-pay

## 2022-09-30 VITALS — BP 121/71 | HR 77 | Wt 321.6 lb

## 2022-09-30 DIAGNOSIS — O10913 Unspecified pre-existing hypertension complicating pregnancy, third trimester: Secondary | ICD-10-CM

## 2022-09-30 DIAGNOSIS — R7309 Other abnormal glucose: Secondary | ICD-10-CM

## 2022-09-30 DIAGNOSIS — Z6841 Body Mass Index (BMI) 40.0 and over, adult: Secondary | ICD-10-CM

## 2022-09-30 DIAGNOSIS — Z3A30 30 weeks gestation of pregnancy: Secondary | ICD-10-CM

## 2022-09-30 DIAGNOSIS — O24410 Gestational diabetes mellitus in pregnancy, diet controlled: Secondary | ICD-10-CM

## 2022-09-30 DIAGNOSIS — O0993 Supervision of high risk pregnancy, unspecified, third trimester: Secondary | ICD-10-CM

## 2022-09-30 DIAGNOSIS — O10919 Unspecified pre-existing hypertension complicating pregnancy, unspecified trimester: Secondary | ICD-10-CM

## 2022-09-30 DIAGNOSIS — O0990 Supervision of high risk pregnancy, unspecified, unspecified trimester: Secondary | ICD-10-CM

## 2022-09-30 NOTE — Progress Notes (Signed)
   PRENATAL VISIT NOTE  Subjective:  Deborah [name] is a 27 y.o. G1P0 at [redacted]w[redacted]d being seen today for ongoing prenatal care.  She is currently monitored for the following issues for this high-risk pregnancy and has Elevated hemoglobin A1c; Chronic hypertension during pregnancy (Procardia), antepartum; Supervision of high risk pregnancy, antepartum; BMI 50.0-59.9, adult (HCC); and Gestational diabetes on their problem list.  Patient reports no complaints.  Contractions: Not present. Vag. Bleeding: None.  Movement: Present. Denies leaking of fluid.   The following portions of the patient's history were reviewed and updated as appropriate: allergies, current medications, past family history, past medical history, past social history, past surgical history and problem list.   Objective:   Vitals:   09/30/22 1128  BP: 121/71  Pulse: 77  Weight: (!) 321 lb 9.6 oz (145.9 kg)    Fetal Status: Fetal Heart Rate (bpm): 154   Movement: Present     General:  Alert, oriented and cooperative. Patient is in no acute distress.  Skin: Skin is warm and dry. No rash noted.   Cardiovascular: Normal heart rate noted  Respiratory: Normal respiratory effort, no problems with respiration noted  Abdomen: Soft, gravid, appropriate for gestational age.  Pain/Pressure: Present     Pelvic: Cervical exam deferred        Extremities: Normal range of motion.  Edema: None  Mental Status: Normal mood and affect. Normal behavior. Normal judgment and thought content.   Assessment and Plan:  Pregnancy: G1P0 at [redacted]w[redacted]d 1. Chronic hypertension during pregnancy (Procardia), antepartum - Stable on Procardia  - Pre-E precautions - Antenatal testing per MFM  2. Diet controlled gestational diabetes mellitus (GDM) in third trimester--Suspect T2 - Stable off meds - Antenatal testing per MFM  3. Supervision of high risk pregnancy, antepartum   4. BMI 50.0-59.9, adult (HCC) - Limit wt gain - Antenatal testing per  MFM  5. [redacted] weeks gestation of pregnancy   Preterm labor symptoms and general obstetric precautions including but not limited to vaginal bleeding, contractions, leaking of fluid and fetal movement were reviewed in detail with the patient. Please refer to After Visit Summary for other counseling recommendations.   No follow-ups on file.  Future Appointments  Date Time Provider Department Center  10/10/2022  9:30 AM [facility] NURSE [facility]  10/10/2022  9:45 AM [facility] NST [facility]  10/17/2022 10:30 AM [facility] NURSE [facility]  10/17/2022 10:45 AM [facility] NST [facility]  10/24/2022  7:45 AM [facility] NURSE [facility] [address]  10/24/2022  8:00 AM [facility] US1 [facility]    Dorathy Kinsman, CNM

## 2022-09-30 NOTE — Patient Instructions (Signed)
[place] [facility] Providers  [place]outheast [place] ([address]) [facility] Brown, MD; Chambliss, MD; Eniola, MD; Hensel, MD; McDiarmid, MD; McIntyer, MD [address] [place], [place] [address] [phone] Mon-Fri 8:30-12:30, 1:30-5:00  Providers come to see babies during newborn hospitalization Only accepting infants of Mother's who are seen at Family Medicine Center or have siblings seen at   Family Medicine Center Medicaid - Yes; Tricare - Yes   [facility] Mulberry, MD [address] [place], [place] [address] [phone] Mon, Tue, Thur, Fri 8:30-5:00, Wed 10:00-7:00 (closed 1-2pm daily for lunch) Takes [place] residents with no insurance.  [place] [facility] only with Medicaid/insurance; Tricare - no  [facility] Ben-Davies, MD; Brown, MD; Chandler, MD; Ettefagh, MD; Grant, MD; Hanvey, MD; Herrin, MD; Jones,  MD; Lester, MD; McCormick, MD; McQueen, MD; Simha, MD; Stanley, MD; Stryffeler, NP [address], [place], [place] [address] [phone] Mon, Tue, Thur, Fri 8:30-5:30, Wed 9:30-5:30, Sat 8:30-12:30 Only accepting infants of first-time parents or siblings of current patients Hospital discharge coordinator will make follow-up appointment Medicaid - yes; Tricare - yes  [place] ([address]) [facility] Cox, MD; Davis, MD; Dovico, MD; Ettefaugh, MD; Lowe, MD; Nation, MD; Slimp, MD; Sumner, MD; Williams, MD [address], [place], [place] [address] [phone] Mon-Fri 8:30-5:00, closed for lunch 12:30-1:30; Sat-Sun 10:00-1:00 Accepting Newborns with commercial insurance only, must call prior to delivery to be accepted into  practice.  Medicaid - no, Tricare - yes   [facility] [address] [place], [place] [address] [phone] or [phone] Mon to Fri 8am to 10pm, Sat 8am to 1pm  (virtual only on weekends) Only accepts Medicaid Healthy Blue pts  [facility]  Artis, MD; Coccaro, MD; Lockett Gardner, MD; Netherton, NP; Roper, MD; Wilmot, PA-C; Skinner, MD [address] [place], [place] [address] [phone] Mon-Fri 8:30-5:30 Medicaid - yes, Tricare - yes  [place] ([address]) [facility] Warner, MD [address], [place], [place] [address] [phone] Mon, Tues, Wed Fri 8:30-5:00, Sat 8:30-12:00, Closed Thursdays Accepting siblings of established patients and first time mom's if you call prenatally Medicaid- yes; Tricare - yes  [facility] Becker, PA; Hagler, MD; Quinn, PA-C; Scifres, PA; Sun, MD; Swayne, MD;  [address], [place], [place] [address] [phone] Mon-Fri 8:30-5:00, closed for lunch 1-2 Only accepting newborns of established patients Medicaid- no; Tricare - yes  [place] ([address]) [facility] Timberlake, MD; [address], [place], [place] [address] [phone] Mon-Fri 8:00-5:00 Medicaid - No; Tricare - Yes  [facility] [employer]  Brake, NP; Wharton, PA [address], [place], [place] [address] [phone] Mon-Fri 8:00-5:00 Medicaid - No, Tricare - Yes  [facility] Gay, MD; Quinlan, MD; Blatt, DNP [address] [place], [place] [address] [phone]  Mon-Fri 8:00-5:00 Medicaid - No; Tricare - Yes  [facility] [address] [place], [place] [address] [phone] Mon-Fri 8:30-5:00 (lunch 12:00-1:00) Medicaid -Yes; Tricare - Yes  [facility] Jordan, MD [address], , [place] [address] [phone] Mon-Fri 8:00-5:00 Seeing newborns of current patients only. No new patients Medicaid - No, Tricare - yes  [facility] Parker, MD [address] ,  [address] [phone] Mon-Fri 8:00-5:00 Medicaid -yes as secondary coverage only;   Tricare - yes  [facility] Brecken, PA; Christy, NP; Dees, MD; DeClaire, MD; DeWeese, MD; Hodge, PA; Smoot, NP; Summer, MD; Vapne, MD [address] [place], [place] [address] [phone] Mon-Fri 8:30-5:00, Sat 9:00-11:00 Accepts commercial insurance ONLY. Offers free prenatal information sessions for families. Medicaid - No, Tricare - Call first  [facility] Bouska, MD; Gordon, PA; Jeffery, PA; Weber, PA [address] [place] [address] [phone] Mon-Fri 7:30-5:30 Medicaid - Yes; Tricare - yes  [place] ([address] & [address])  [facility] Reese, MD [address] [place], [place] [address] [phone] Mon-Thur 8:00-6:00, closed for lunch 12-2, closed Fridays Medicaid - yes; Tricare - no  [facility] Anderson, NP; Badger, MD; Beal, PA; Spencer, PA [address], [place], [place] [address] [phone] Mon-Fri 7:30-4:30 Medicaid - yes, Tricare - yes  [facility]  Agbuya, MD; Klett, NP; Romgoolam, MD; Rothstein, NP [address], [place], [place] [address] [phone] Mon-Fri 8:30-5:00, closed for lunch 1-2, Sat 8:30-12:00 - sick visits only Providers come to see babies at [facility] Only accepting newborns of siblings and first time parents ONLY if who have met with office prior to delivery Medicaid -Yes; Tricare - yes  [facility] [place]  Golden, DO; Friddle, NP; Wallace, MD; Wood, MD:  [address], [place], [place] [address] [phone] Mon- Fri 8:00-5:00, Sat 9:00-12:00 - sick visits only Accepting siblings of established patients and first time mom/baby Medicaid - Yes; Tricare - yes Patients must have vaccinations (baby vaccines)  [place] ([address]  [address])  [facility] [facility] [address] [place], [place] [address] [phone] Mon-Fri 8:00-5:00 Medicaid - no; Tricare - yes  [facility] Briscoe, MD; Schmidt, PA; Moreira, PA [address], [place], [place] [address] [phone] Mon-Fri 8:00-5:00 Medicaid- yes; Tricare - yes  [facility] [place] Boyd, MD; Jones, NP; Osborn, PA [address], , [place] [address] [phone] Mon-Fri 8:00-5:00 Medicaid - Yes; Tricare - yes  [place] ([address])  [facility] Atkinson, PA; Calderon, PA; Cummings, MD; Dillard, MD; Henrish, NP; Isenhour, DO; Martin, PA; Olson, MD; Ott, MD; Phillips, MD; Valente, PA; VanDeven, PA; Yonjof, NP [address], [place], [place] [address] [phone] Mon-Fri 8:30-5:00, Sat 9:00-12:00 - sick only Please register online [email or web address] then schedule online or call office Medicaid-Yes; Tricare -yes  [facility]  Dabrusco, MD; Dial, MD; [place], MD; Fleenor, NP; Goolsby, PA; Tonuzi, MD; Turner, NP; West, MD [address], [place], [place] [address] [phone] Mon-Fri 8:00-5:30, Sat&Sun by appointment (phones open at 8:30) Medicaid - Yes; Tricare - yes  [place] ([address]) [facility] Allen, CPNP; Bates, MD; Gordon, MD; Mills, NP; Weinshilboum, DO [address], [place], [place] [address] [phone] M-F 8:00 - 5:15, Sat/Sun 9-12 sick visits only Medicaid - No; Tricare - yes  [facility] [place] [facility]  Brown, PA-C; Cowen, PA-C; Dennis, DO; Fuster, PA-C; Martin, PA-C; Shelton, PA-C; Spry, MD [address] [place],  [address] [phone] Mon-Thur 8:00-7:00, Fri 8:00-5:00 Accepting Medicaid for 13 and under only   [facility] [place] [facility] [place] Hayes, FNP; List, FNP; Moran, MD; Pitonzo, PA-C; Scholer,   MD; Spangle, FNP; Nzenwa, FNP; Jasper, MD; Moran, MD [address] [place] [place], [place] [address] [phone] Mon-Fri 8:30-5:30 Medicaid - Yes; Tricare - yes  [facility] [address] [facility]  Kelly, CPNP; [place], MD; Poth, MD; Ramadoss, MD; Staton, NP [address], [place], [place] [address] [phone] Mon-Thur 8:00-5:30, Fri 8:00-5:00, Sat 9:00-12:00 Medicaid - yes, Tricare - yes  [place] ([address])  [facility] [place] Masneri, DO; Meyers, MD; Nelson, PA [address], [place], [place] [address] [phone] Mon-Fri 8:00-5:00, closed for lunch 12-1 Medicaid - No; Tricare - yes  [facility] [place] McGowen, MD [address], [place], [place] [address] [phone] Mon-Fri 8:00-5:00 Medicaid - No; Tricare - yes  [facility] [place] MacDonald, MD; Nayak, MD; Kearns, MD; Jones, MD [address] [place] [address], [place], [place] [address] [phone] Mon-Fri 8:00-5:00 Medicaid- Yes; Tricare - yes  [place] ([address])   [facility] [place] Martin, PA-C; Tabori, MD [address], [place], [place] [address] [phone] Mon-Fri 8:00-5:00 Medicaid - No; Tricare - yes  [facility] [place]  Margin - CPNP [address], [place], [place] [address] [phone] Mon-Weds 8:00-6:00, Thurs-Fri 8:00-5:00, Sat 9:00-12:00 Medicaid - yes; Tricare - yes   [facility] [place] Aubuchon, MD; Brandon, PA [address] [place], [place] [address] [phone] Mon-Fri 8:00-5:00 Medicaid - yes; Tricare - yes  [place] [facility] Providers  [facility] [address], [place], [place] [address] [phone] M, Thur: 8am -8pm, Tues, Weds: 8am - 5pm; Fri: 8-1 Medicaid - Yes; Tricare -  yes  [facility] Mertz, MD; Johnson, MD; Wells, MD; Downs, PA; Hockenberger, PA [address], [place], [place] [address] [phone] M-F 8:30 - 5:00 Medicaid - Call office; Tricare -yes  [facility] Bonney, MD; Page, MD, Minter, MD; Mueller, PNP; Thomason, NP [address], [place], [place] [address] [phone] M-F 8:30 - 5:00, Sat/Sun 8:30 - 12:30 (sick visits) Medicaid - Call office; Tricare -yes  [facility] Lewis, MD; Shaub, PNP; Boylston, MD; Quaile, PA; Nonato, NP; Landon, CPNP [address], [place], [place] [address] [phone] M-F 8:30 - 5:00 Medicaid - Call office; Tricare - yes  [facility] [place] Cline, MD; Dvergsten, MD; Flores, MD; Kawatu, MD; Nogo, MD [address], [place], [place] [address] [phone] M-Thur: 8:00 - 5:00; Fri: 8:00 - 4:00 Medicaid - yes; Tricare - yes  [facility] [address] [place], [place], [place] [address] [phone] M-F: 8:30- 5:00, closed for lunch 12:30 - 1:00 Medicaid - yes; Tricare -yes  [facility] [place] [address], [place], [place] [address] [phone] M-F 8:00 - 5:00 Medicaid - yes; Tricare - yes  [facility] Johnson, DO; Rumball, DO; Wicker, NP [address] [place] [address], [place], [place] [address] [phone] M-F 8:00 - 5:00, Closed 12-1 for lunch Medicaid - Call; Tricare - yes  [facility] Stein, MD [address], , [place] [address] [phone] M-F: 8:00-5:00, Sat: 8:00 - noon Medicaid - call; Tricare -yes  [place] [facility] Providers  [facility] Collins, FNP-C [address], [place], [place] [address] [phone] M-W: 8:00-5:00, Thur: 8:00 - 7:00, Fri: 8:00 - noon Medicaid - yes; Tricare - yes  [facility] [place], FNP [address], [place],   [place] [address] [phone] M-F 8:00 - 5:00, Closed for lunch 12-1 Medicaid -  yes; Tricare - yes  [place] [facility] Providers  [facility] Shanisha Lech, FNP, Melvin, MD, Fay, FNP-C [address] [place] [address] [place], [place], [place] [address] [phone] M-T 8:00-5:00, Wed-Fri 7:00-6:00 Medicaid - Yes; Tricare -yes  [facility] [place] Civiletti, DO; [address], [place], [place] [address] [phone] M-F 8:00 - 5:00, closed for lunch 12-1 Medicaid - Yes; Tricare - yes  [facility]  Barnes, MD; Bergdolt, MD; Caulfield, MD; Emrich, MD; Fiscus, MD; Hoppens, MD; Kylstra, MD, McPherson, MD; Todd, MD; Prestwood, MD; Waters, MD; Wood, MD [address], [place], [place] [address] [phone] M-F 8:00-5:00 Medicaid - yes; Tricare - yes  [facility] Cheema, MD (speaks Punjabi and Hindi) [address] [place], [place] [address] [phone] M-F: 8:30 - 5:00, closed 12:30 - 1 for lunch Medicaid - Yes; Tricare -yes  [place] [facility] Providers  [facility] Loda, MD; Timberlake, MD; Burke, MD [address], [place], [place] [address] [phone] M-Th: 8:00 - 5:30, Fri: 8:00 - 12:00 Medicaid - yes; Tricare - yes  [facility] [place] Lookabill, NP; Meier, MD; Daffron, MD [address], [place], [place] [address] [phone] M-F: 8:00 - 5:00 Medicaid - yes; Tricare - yes  [facility] Busse, NP; Bowman, NP; Baune, NP; Entwistle, MD; Williams, MD, Huffman, NP, Ferguson, MD; Patel, DO [address], [place], [place] [address] [phone] M-F: 8:30 - 5:30p Medicaid - yes; Tricare - yes Other locations available as well  [facility] [facility] Rajan, MD; Wilson, MD; Morgan, PA-C, Domenech, PA-C; Myers, PA-C [address], [place], [place] [address] [phone] M-W: 8:00am - 7:00pm, Thurs: 8:00am - 8:00pm; Fri: 8:00am -  5:00pm, closed daily from 12-1 for lunch Medicaid - yes; Tricare - yes  [place] [facility] Providers  [facility] [place], MD; Crystal, FNP; Hadley, MD; Stokes, MD; Johnson, PNP; Brady, PA-C; West, PNP; Gardner, MD;  [address] [place] [address], [place], [place] [address] [phone] M - Fri: 8am - 5pm, Sat 9-noon Medicaid - Yes; Tricare -yes  [facility] [place] Nayak, MD; Jones, FNP; McDonald, MD; Kearns, MD [address] [place] [address], [place], [place] [phone] M-F 8:00 - 5:00 Medicaid - call; Tricare - yes  [facility] [place] Bell, MD; Emory, PNP; Pinder, MD; Anderson, MD; Light, PA-C; Johnson, MD; Latta, MD; Saul, PNP; Rainey, MD; Clifford, MD; McClung, MD [address], [place], [place] [address] [phone] M-F 8:00am - 5:00pm; Sat. 9:00 - 11:00 Medicaid - yes; Tricare - yes  [facility] [place] Soldato-Couture, MD [address], [place], [place] [address] [phone] M-F 8:00 - 5:00 Medicaid - [place] Medicaid only; Tricare - yes  [facility] [place] Walker, MD; Davis, PNP; Ajizian, MD [address] [place] [address], [place], [place] [address] [phone] M-F 8:00 - 5:00 Medicaid - yes; Tricare - yes  [facility] [place] Barry, MD; Brown, MD, [place], MD, Hazek, MD; Hoyle, MD; Teeghan Hammer, MD; [address] [place], [place], [place] [address] [phone] M-F: 8-5 Medicaid - yes; Tricare - yes  [facility] [place] Brady, Md; Dowlen, MD; [address] [place] [address], [place], [place] [address] [phone] M-F 8-5 Medicaid - yes; Tricare - yes  [facility] Kearns, MD; Nayak, MD;   Sold[place]-Courture, MD; Pellam-Palmer, DNP; Herring, PNP [address], [place], [place] [address] [phone] M-F 8-5 Medicaid - yes; Tricare - yes  [facility] Weathers, MD;  Merritt, PA-C; Davis-PA-C; Warnimont, MD [address], [place], [place] [address] [phone] M-F 7am - 5 pm Medicaid - call; Tricare - yes  [facility] Hill, PNP; Erickson, MD; Robinson, MD [address], [place], [place] [address] [phone] M-F 8-5 Medicaid - yes; Tricare - yes  [facility] Kribbs, MD; Warner, MD; Williams, FNP; Brooks, FNP; Boles, FNP; Romblad, PA-C; [place] - FNP [address], [place], [place] [address] [phone] M-F 8-5 Medicaid- yes; Tricare - yes  [facility] [place] [place] [place] [facility] [place] Yoder, MD; Verenes, MD; Armentrout, MD; Stewart, MD; Beasley, CPNP; Ford, MD; Erickson, MD; Rice, MD [address], [place], [place] [address] [phone] M-F: 8-5, Sat: 9-4, Sun 9-12 Medicaid - yes; Tricare - yes  [facility] Little, PNP; Davis, PNP [address] [place], [place] [address] [phone] M-F 8 - 5, closed 12-1 for lunch Medicaid - yes; Tricare - yes  [facility] Friesen, MD; Cnegia, MD; Rice, MD; Patel, DO [address], [place], [place] [address] [phone] M-F 8- 5:30 Medicaid - yes; Tricare - yes  [facility] [place] Zvolensky, MD; Ray, MD; Haas, MD [address] [address], [place], [place] [address] [phone] M: 8-7; Tues-Fri: 8-5; Sat: 9-12 Medicaid - yes; Tricare - yes  [facility] [place] Heinrich, MD; Meyer, MD; Clark, MD; Rhyne, MD; Aubuchon, MD [address], [place], [place] [address] [phone] M: 8-7; Tues-Fri: 8-5; Sat: 8:30-12:30 Medicaid - yes; Tricare - yes  [facility] [place] Bista, MD; Dillard, PA [address], [place], [place] [address] [phone] Mon-Fri: 8-5 Medicaid - yes;  Tricare - yes  [facility] [place] Beasley, CPNP; Mahle, CPNP; Rice, MD; Duffy, MD; Culler, MD; [address], [place], [place] [address] [phone] M-F: 8-5, closed 1-2 for lunch Medicaid - yes; Tricare - yes  [facility] Rickman, PA; Mounce, NP; Vaneza Pickart, MD; Jordan, CPNP; Darty, PA; Ball, MD; Wallace, MD [address] [place] [address], , [place] [address] [phone] M-Thurs: 8-7; Fri: 8-6; Sat: 9-12; Sun 2-4 Medicaid - yes; Tricare - yes  [facility] Brown, MD; Shin, MD; Goodman, DNP, FNP; Sebesta, DO; [address], [place], [place] [address] [phone] M-F: 8-5 Medicaid - yes; Tricare - yes  [place] [facility] Providers  [facility] Dough, MD; Welsh, NP [address], [place], [place] [address] [phone] M - Fri: 8am - 5pm, closed for lunch 12-1 Medicaid - Yes; Tricare - yes  [facility] Manandhar, MD; Riley, MD; Sanger, DO; Vinocur, MD;Hall, PA; Walsh, PA; [name], NP [address], [place] [address] [phone] M-F 8:00 - 5:00, Sat 8:00 - 11:30 Medicaid - yes; Tricare - yes  [facility] Khan, MD; Redding, MD, Street, MD, Holt, MD, Burgart, MD; Rhyne, NP; Dickinson, PA;  [address] [place] [address], [place], [place] [address] [phone] M-F 8:10am - 5:00pm Medicaid - yes; Tricare - yes    [facility] Connors, MD; Kime, NP [address], [place], [place] [address] [phone] M-F 8:00 - 5:00 Medicaid - [place] Medicaid only; Tricare - yes  [facility] [place] Whyte, MD; Fox, NP [address], [place], [place] [address] [phone] M-F 8:00 - 5:00; Closed for lunch 12 - 1:00 Medicaid - yes;  Tricare - yes  [facility] Penner, MD; Wilburn, FNP [address], , [place] [address] [phone] Mon 9-5; Tues/Wed 10-5; Thurs 8:30-5; Fri: 8-12:30 Medicaid - yes; Tricare - yes  [place] [facility] Providers  [facility]  Golding, MD; Jackson, PA-C [address], [place], [place] [address] [phone] phone [phone] fax M-F 7:15 - 4:30 Medicaid - yes; Tricare - yes  [facility] Gosrani, MD; Meccariello, DO [address] [place], [place] [address] [phone] M-Fri: 8:30 - 5:00, closed for lunch everyday noon - 1pm Medicaid - Yes; Tricare - yes  [facility] Burdine, MD; Daniel, MD; Howard, MD; Sasser, MD; Boles, PA; Boyd, PA-C; Carroll, PA; McGee, PA; Skillman, PA; Wilson, PA [address] [place], [place] [address] [phone] M-Thurs: 7:30am - 7:00pm; Friday 7:30am - 4pm; Sat: 8:00 - 1:00 Medicaid - Yes; Tricare - yes  [facility] [place] Akhbari, MD; Law, MD; Qayumi, MD; Salvador, DO [address], [place], [place] [address] [phone] M-Thur: 8:00 - 5:00, Fri: 8:00 - Noon Medicaid - yes; Tricare - yes No [place] [employer]  [facility] [place] [facility] Dettinger, MD; Gottschalk, DO; Hawks, NP; Martin, NP; Morgan, NP; Milian, NP; Rakes, NP; Stacks, MD; Webster, PA [address], [place], [place] [address] [phone] M-F 8:00 - 5:00 Medicaid - yes; Tricare - yes  [facility] Collins, FNP-C; Bucio, FNP-C [address] [place] [address], [place], [place] [address] [phone] M, W, R 8:00-5:00, Tues: 8:00am - 7:00pm; Fri 8:00 - noon Medicaid - Yes; Tricare - yes  [facility] Khan, MD [address] [place] [address] [place],  [address] [phone]  M-Thurs 8:30-5:30, Fri: 8:30-12:30pm Medicaid - Yes; Tricare - N  

## 2022-10-02 ENCOUNTER — Encounter: Payer: Self-pay | Admitting: Advanced Practice Midwife

## 2022-10-10 ENCOUNTER — Ambulatory Visit: Payer: BC Managed Care – PPO | Attending: Maternal & Fetal Medicine | Admitting: *Deleted

## 2022-10-10 ENCOUNTER — Ambulatory Visit: Payer: BC Managed Care – PPO | Admitting: *Deleted

## 2022-10-10 VITALS — BP 135/77 | HR 89

## 2022-10-10 DIAGNOSIS — O10913 Unspecified pre-existing hypertension complicating pregnancy, third trimester: Secondary | ICD-10-CM | POA: Diagnosis not present

## 2022-10-10 DIAGNOSIS — O99213 Obesity complicating pregnancy, third trimester: Secondary | ICD-10-CM | POA: Diagnosis not present

## 2022-10-10 DIAGNOSIS — O0990 Supervision of high risk pregnancy, unspecified, unspecified trimester: Secondary | ICD-10-CM

## 2022-10-10 DIAGNOSIS — O24419 Gestational diabetes mellitus in pregnancy, unspecified control: Secondary | ICD-10-CM

## 2022-10-10 DIAGNOSIS — O24415 Gestational diabetes mellitus in pregnancy, controlled by oral hypoglycemic drugs: Secondary | ICD-10-CM | POA: Diagnosis not present

## 2022-10-10 DIAGNOSIS — Z3A32 32 weeks gestation of pregnancy: Secondary | ICD-10-CM | POA: Diagnosis not present

## 2022-10-10 DIAGNOSIS — O10013 Pre-existing essential hypertension complicating pregnancy, third trimester: Secondary | ICD-10-CM | POA: Insufficient documentation

## 2022-10-10 DIAGNOSIS — O10919 Unspecified pre-existing hypertension complicating pregnancy, unspecified trimester: Secondary | ICD-10-CM

## 2022-10-10 NOTE — Procedures (Signed)
Deborah [name] Hair 21-Mar-1996 [redacted]w[redacted]d  Fetus A Non-Stress Test Interpretation for 10/10/22  Indication: Chronic Hypertenstion and Gestational Diabetes medication controlled  Fetal Heart Rate A Mode: External Baseline Rate (A): 150 bpm Variability: Moderate Accelerations: 15 x 15 Multiple birth?: No  Uterine Activity Mode: Toco Contraction Frequency (min): none Resting Tone Palpated: Relaxed  Interpretation (Fetal Testing) Nonstress Test Interpretation: Reactive Overall Impression: Reassuring for gestational age Comments: Tracing reviewed byDr. Parke Poisson

## 2022-10-14 ENCOUNTER — Ambulatory Visit (INDEPENDENT_AMBULATORY_CARE_PROVIDER_SITE_OTHER): Payer: BC Managed Care – PPO | Admitting: Obstetrics & Gynecology

## 2022-10-14 ENCOUNTER — Other Ambulatory Visit: Payer: Self-pay

## 2022-10-14 VITALS — BP 125/76 | HR 86 | Wt 323.2 lb

## 2022-10-14 DIAGNOSIS — R7309 Other abnormal glucose: Secondary | ICD-10-CM

## 2022-10-14 DIAGNOSIS — O0990 Supervision of high risk pregnancy, unspecified, unspecified trimester: Secondary | ICD-10-CM

## 2022-10-14 DIAGNOSIS — O10919 Unspecified pre-existing hypertension complicating pregnancy, unspecified trimester: Secondary | ICD-10-CM

## 2022-10-14 DIAGNOSIS — O10913 Unspecified pre-existing hypertension complicating pregnancy, third trimester: Secondary | ICD-10-CM

## 2022-10-14 DIAGNOSIS — Z3A32 32 weeks gestation of pregnancy: Secondary | ICD-10-CM

## 2022-10-14 DIAGNOSIS — O24410 Gestational diabetes mellitus in pregnancy, diet controlled: Secondary | ICD-10-CM

## 2022-10-14 DIAGNOSIS — Z6841 Body Mass Index (BMI) 40.0 and over, adult: Secondary | ICD-10-CM

## 2022-10-14 DIAGNOSIS — O0993 Supervision of high risk pregnancy, unspecified, third trimester: Secondary | ICD-10-CM

## 2022-10-14 NOTE — Progress Notes (Signed)
Patient had no questions or concerns today

## 2022-10-14 NOTE — Progress Notes (Signed)
   PRENATAL VISIT NOTE  Subjective:  Deborah [name] is a 27 y.o. G1P0 at [redacted]w[redacted]d being seen today for ongoing prenatal care.  She is currently monitored for the following issues for this high-risk pregnancy and has Elevated hemoglobin A1c; Chronic hypertension during pregnancy (Procardia), antepartum; Supervision of high risk pregnancy, antepartum; BMI 50.0-59.9, adult (HCC); and Gestational diabetes mellitus, class A1 on their problem list.  Patient reports no complaints.  Contractions: Not present. Vag. Bleeding: None.  Movement: Present. Denies leaking of fluid.   The following portions of the patient's history were reviewed and updated as appropriate: allergies, current medications, past family history, past medical history, past social history, past surgical history and problem list.   Objective:   Vitals:   10/14/22 1520  BP: 125/76  Pulse: 86  Weight: (!) 323 lb 3.2 oz (146.6 kg)    Fetal Status: Fetal Heart Rate (bpm): 155   Movement: Present     General:  Alert, oriented and cooperative. Patient is in no acute distress.  Skin: Skin is warm and dry. No rash noted.   Cardiovascular: Normal heart rate noted  Respiratory: Normal respiratory effort, no problems with respiration noted  Abdomen: Soft, gravid, appropriate for gestational age.  Pain/Pressure: Absent     Pelvic: Cervical exam deferred        Extremities: Normal range of motion.     Mental Status: Normal mood and affect. Normal behavior. Normal judgment and thought content.   Assessment and Plan:  Pregnancy: G1P0 at [redacted]w[redacted]d 1. Chronic hypertension during pregnancy (Procardia), antepartum Well controled  2. Gestational diabetes mellitus, class A1 90% = in range diet control  3. BMI 50.0-59.9, adult (HCC) Body mass index is 50.62 kg/m.   4. Supervision of high risk pregnancy, antepartum BPP start next week  5. Elevated hemoglobin A1c   Preterm labor symptoms and general obstetric precautions including  but not limited to vaginal bleeding, contractions, leaking of fluid and fetal movement were reviewed in detail with the patient. Please refer to After Visit Summary for other counseling recommendations.   Return in about 2 weeks (around 10/28/2022).  Future Appointments  Date Time Provider Department Center  10/17/2022 10:30 AM [facility] NURSE [facility]  10/17/2022 10:45 AM [facility] NST [facility]  10/24/2022  7:45 AM [facility] NURSE [facility]  10/24/2022  8:00 AM [facility] US1 [facility]    Scheryl Darter, MD

## 2022-10-17 ENCOUNTER — Ambulatory Visit (HOSPITAL_BASED_OUTPATIENT_CLINIC_OR_DEPARTMENT_OTHER): Payer: BC Managed Care – PPO | Admitting: *Deleted

## 2022-10-17 ENCOUNTER — Ambulatory Visit: Payer: BC Managed Care – PPO | Attending: Maternal & Fetal Medicine | Admitting: *Deleted

## 2022-10-17 VITALS — BP 128/72 | HR 65

## 2022-10-17 DIAGNOSIS — Z3A32 32 weeks gestation of pregnancy: Secondary | ICD-10-CM | POA: Insufficient documentation

## 2022-10-17 DIAGNOSIS — E669 Obesity, unspecified: Secondary | ICD-10-CM

## 2022-10-17 DIAGNOSIS — Z3A33 33 weeks gestation of pregnancy: Secondary | ICD-10-CM | POA: Diagnosis not present

## 2022-10-17 DIAGNOSIS — O10913 Unspecified pre-existing hypertension complicating pregnancy, third trimester: Secondary | ICD-10-CM

## 2022-10-17 DIAGNOSIS — O99213 Obesity complicating pregnancy, third trimester: Secondary | ICD-10-CM | POA: Diagnosis not present

## 2022-10-17 DIAGNOSIS — O10013 Pre-existing essential hypertension complicating pregnancy, third trimester: Secondary | ICD-10-CM | POA: Diagnosis not present

## 2022-10-17 DIAGNOSIS — O0990 Supervision of high risk pregnancy, unspecified, unspecified trimester: Secondary | ICD-10-CM

## 2022-10-17 DIAGNOSIS — O10919 Unspecified pre-existing hypertension complicating pregnancy, unspecified trimester: Secondary | ICD-10-CM

## 2022-10-17 NOTE — Procedures (Signed)
Deborah [name] 18-Feb-1996 [redacted]w[redacted]d  Fetus A Non-Stress Test Interpretation for 10/17/22-NST Only  Indication: Chronic Hypertenstion and morbidly obese  Fetal Heart Rate A Mode: External Baseline Rate (A): 155 bpm Variability: Moderate Accelerations: 15 x 15 Decelerations: None Multiple birth?: No  Uterine Activity Mode: Toco Contraction Frequency (min): 1 uc during NST Contraction Duration (sec): 50 Contraction Quality: Mild Resting Tone Palpated: Relaxed  Interpretation (Fetal Testing) Nonstress Test Interpretation: Reactive Overall Impression: Reassuring for gestational age Comments: Tracing reviewed by Dr. Judeth Cornfield

## 2022-10-24 ENCOUNTER — Ambulatory Visit: Payer: BC Managed Care – PPO | Admitting: *Deleted

## 2022-10-24 ENCOUNTER — Inpatient Hospital Stay (HOSPITAL_COMMUNITY)
Admission: AD | Admit: 2022-10-24 | Discharge: 2022-10-24 | Disposition: A | Discharge status: Home / Self Care | Payer: BC Managed Care – PPO | Attending: Obstetrics & Gynecology | Admitting: Obstetrics & Gynecology

## 2022-10-24 ENCOUNTER — Inpatient Hospital Stay (HOSPITAL_BASED_OUTPATIENT_CLINIC_OR_DEPARTMENT_OTHER): Payer: BC Managed Care – PPO

## 2022-10-24 ENCOUNTER — Encounter (HOSPITAL_COMMUNITY): Payer: Self-pay | Admitting: Obstetrics & Gynecology

## 2022-10-24 ENCOUNTER — Other Ambulatory Visit: Payer: Self-pay | Admitting: *Deleted

## 2022-10-24 ENCOUNTER — Ambulatory Visit (HOSPITAL_BASED_OUTPATIENT_CLINIC_OR_DEPARTMENT_OTHER): Payer: BC Managed Care – PPO

## 2022-10-24 VITALS — BP 134/66 | HR 63

## 2022-10-24 DIAGNOSIS — O10919 Unspecified pre-existing hypertension complicating pregnancy, unspecified trimester: Secondary | ICD-10-CM

## 2022-10-24 DIAGNOSIS — Z3A34 34 weeks gestation of pregnancy: Secondary | ICD-10-CM | POA: Diagnosis not present

## 2022-10-24 DIAGNOSIS — O3663X0 Maternal care for excessive fetal growth, third trimester, not applicable or unspecified: Secondary | ICD-10-CM | POA: Insufficient documentation

## 2022-10-24 DIAGNOSIS — O10913 Unspecified pre-existing hypertension complicating pregnancy, third trimester: Secondary | ICD-10-CM

## 2022-10-24 DIAGNOSIS — O99213 Obesity complicating pregnancy, third trimester: Secondary | ICD-10-CM | POA: Insufficient documentation

## 2022-10-24 DIAGNOSIS — O368130 Decreased fetal movements, third trimester, not applicable or unspecified: Secondary | ICD-10-CM

## 2022-10-24 DIAGNOSIS — O24410 Gestational diabetes mellitus in pregnancy, diet controlled: Secondary | ICD-10-CM

## 2022-10-24 DIAGNOSIS — O24419 Gestational diabetes mellitus in pregnancy, unspecified control: Secondary | ICD-10-CM

## 2022-10-24 DIAGNOSIS — O99212 Obesity complicating pregnancy, second trimester: Secondary | ICD-10-CM | POA: Diagnosis not present

## 2022-10-24 DIAGNOSIS — E669 Obesity, unspecified: Secondary | ICD-10-CM

## 2022-10-24 DIAGNOSIS — O0990 Supervision of high risk pregnancy, unspecified, unspecified trimester: Secondary | ICD-10-CM | POA: Insufficient documentation

## 2022-10-24 DIAGNOSIS — O288 Other abnormal findings on antenatal screening of mother: Secondary | ICD-10-CM | POA: Insufficient documentation

## 2022-10-24 DIAGNOSIS — Z3689 Encounter for other specified antenatal screening: Secondary | ICD-10-CM

## 2022-10-24 DIAGNOSIS — O10013 Pre-existing essential hypertension complicating pregnancy, third trimester: Secondary | ICD-10-CM

## 2022-10-24 NOTE — MAU Provider Note (Signed)
History     CSN: [id number]  Arrival date and time: 10/24/22 0943   None     Chief Complaint  Patient presents with   Failed BPP   HPI Deborah [name] is a 27 y.o. G1P0 at [redacted]w[redacted]d who presents to MAU from MFM for extended fetal monitoring and repeat BPP. Patient had a BPP 4/8 this morning. Patient reports she is feeling normal fetal movement. She had breakfast prior to her ultrasound appointment. She is not having contractions, vaginal bleeding or leaking fluid. No headaches, vision changes, or RUQ/epigastric pain.    Pregnancy course: cHTN (Procardia), A1GDM, LGA, obesity. She receives [facility], next appointment is on 7/3. She also has an appointment at [facility] on 7/5.     OB History     Gravida  1   Para      Term      Preterm      AB      Living         SAB      IAB      Ectopic      Multiple      Live Births              Past Medical History:  Diagnosis Date   Elevated blood pressure reading - suspected cHTN 04/04/2022   Elevated BP at RN visit for UPT, no prior dx of HTN  Baseline P/C 0.176, serum labs WNL  BP check scheduled 12/14 for confirmation of cHTN diagnosis   Gestational diabetes mellitus, class A1 05/19/2022   Hypertension    Pre-diabetes 2024    Past Surgical History:  Procedure Laterality Date   MANDIBLE SURGERY Right 2018    History reviewed. No pertinent family history.  Social History   Tobacco Use   Smoking status: Never    Passive exposure: Never   Smokeless tobacco: Never  Vaping Use   Vaping Use: Never used  Substance Use Topics   Alcohol use: Not Currently   Drug use: Never    Allergies: No Known Allergies  No medications prior to admission.   Review of Systems  All other systems reviewed and are negative.  Physical Exam   Blood pressure (!) 119/56, pulse 70, temperature 97.9 F (36.6 C), temperature source Oral, resp. rate 16, height 5\' 7"  (1.702 m), weight (!) 147.7 kg, last menstrual period 02/11/2022,  SpO2 99 %.  Physical Exam Vitals and nursing note reviewed.  Constitutional:      General: She is not in acute distress.    Appearance: She is obese.  Eyes:     Extraocular Movements: Extraocular movements intact.     Pupils: Pupils are equal, round, and reactive to light.  Cardiovascular:     Rate and Rhythm: Normal rate.  Pulmonary:     Effort: Pulmonary effort is normal.  Abdominal:     Palpations: Abdomen is soft.     Tenderness: There is no abdominal tenderness.  Musculoskeletal:        General: Normal range of motion.     Cervical back: Normal range of motion.  Skin:    General: Skin is warm and dry.  Neurological:     General: No focal deficit present.     Mental Status: She is alert and oriented to person, place, and time.  Psychiatric:        Mood and Affect: Mood normal.        Behavior: Behavior normal.    NST FHR: 140  bpm, moderate variability, +15x15 accels, no decels Toco:  quiet      MAU Course  Procedures  MDM Extended fetal monitoring BPP  NST reactive and reassuring. Patient feeling normal fetal movement. BPP repeated 5 hours after initial per MFM recommendations. Repeat BPP 10/10.   Assessment and Plan   1. [redacted] weeks gestation of pregnancy   2. NST (non-stress test) reactive    - Discharge home in stable condition - Fetal kick counts reviewed. Return to MAU as needed - Keep appointment as scheduled on 7/3 at [facility], CNM 10/24/2022, 2:16 PM

## 2022-10-24 NOTE — MAU Note (Signed)
...  Deborah [name] is a 27 y.o. at [redacted]w[redacted]d here in MAU reporting: Sent over from MFM for repeat BPP around 1200 or 1300. BPP was 4/8 for movement and tone. She reports feeling regular and vigorous movements. Denies VB or LOF. Denies pain.  CHTN. Nifedepine 30 mg. Suspect T2DM.  Pain score: Denies pain.   FHT: 135 initial external Lab orders placed from triage: none

## 2022-10-29 ENCOUNTER — Other Ambulatory Visit: Payer: Self-pay

## 2022-10-29 ENCOUNTER — Ambulatory Visit (INDEPENDENT_AMBULATORY_CARE_PROVIDER_SITE_OTHER): Payer: BC Managed Care – PPO | Admitting: Obstetrics & Gynecology

## 2022-10-29 ENCOUNTER — Other Ambulatory Visit: Payer: Self-pay | Admitting: Obstetrics and Gynecology

## 2022-10-29 VITALS — BP 117/76 | HR 85 | Wt 326.4 lb

## 2022-10-29 DIAGNOSIS — O10919 Unspecified pre-existing hypertension complicating pregnancy, unspecified trimester: Secondary | ICD-10-CM

## 2022-10-29 DIAGNOSIS — I10 Essential (primary) hypertension: Secondary | ICD-10-CM

## 2022-10-29 DIAGNOSIS — O0990 Supervision of high risk pregnancy, unspecified, unspecified trimester: Secondary | ICD-10-CM

## 2022-10-29 DIAGNOSIS — Z3A34 34 weeks gestation of pregnancy: Secondary | ICD-10-CM

## 2022-10-29 DIAGNOSIS — O0993 Supervision of high risk pregnancy, unspecified, third trimester: Secondary | ICD-10-CM

## 2022-10-29 DIAGNOSIS — O24410 Gestational diabetes mellitus in pregnancy, diet controlled: Secondary | ICD-10-CM

## 2022-10-29 DIAGNOSIS — O10913 Unspecified pre-existing hypertension complicating pregnancy, third trimester: Secondary | ICD-10-CM

## 2022-10-29 NOTE — Progress Notes (Signed)
   PRENATAL VISIT NOTE  Subjective:  Deborah [name] is a 27 y.o. G1P0 at [redacted]w[redacted]d being seen today for ongoing prenatal care.  She is currently monitored for the following issues for this high-risk pregnancy and has Elevated hemoglobin A1c; Chronic hypertension during pregnancy (Procardia), antepartum; Supervision of high risk pregnancy, antepartum; BMI 50.0-59.9, adult (HCC); and Gestational diabetes mellitus, class A1 on their problem list.  Patient reports no complaints.  Contractions: Not present. Vag. Bleeding: None.  Movement: Present. Denies leaking of fluid.   The following portions of the patient's history were reviewed and updated as appropriate: allergies, current medications, past family history, past medical history, past social history, past surgical history and problem list.   Objective:   Vitals:   10/29/22 1519  BP: 117/76  Pulse: 85  Weight: (!) 326 lb 6.4 oz (148.1 kg)    Fetal Status: Fetal Heart Rate (bpm): 155   Movement: Present     General:  Alert, oriented and cooperative. Patient is in no acute distress.  Skin: Skin is warm and dry. No rash noted.   Cardiovascular: Normal heart rate noted  Respiratory: Normal respiratory effort, no problems with respiration noted  Abdomen: Soft, gravid, appropriate for gestational age.  Pain/Pressure: Absent     Pelvic: Cervical exam deferred        Extremities: Normal range of motion.     Mental Status: Normal mood and affect. Normal behavior. Normal judgment and thought content.   Assessment and Plan:  Pregnancy: G1P0 at [redacted]w[redacted]d 1. Chronic hypertension during pregnancy (Procardia), antepartum BP well controlled  2. Gestational diabetes mellitus, class A1   Media Information  Document Information  Photos    10/29/2022 15:33  Attached To:  Routine Prenatal on 10/29/22 with Adam Phenix, MD  Source Information  Adam Phenix, MD  [facility]    3. Supervision of high risk pregnancy,  antepartum Weekly NST  Preterm labor symptoms and general obstetric precautions including but not limited to vaginal bleeding, contractions, leaking of fluid and fetal movement were reviewed in detail with the patient. Please refer to After Visit Summary for other counseling recommendations.   Return in about 1 week (around 11/05/2022).  Future Appointments  Date Time Provider Department Center  10/31/2022  3:30 PM [facility] NURSE [facility]  10/31/2022  3:45 PM [facility] US4 [facility]  11/05/2022  1:15 PM Warden Fillers, MD [facility]  11/06/2022  9:30 AM [facility] NURSE [facility]  11/06/2022  9:45 AM [facility] US6 [facility]  11/14/2022 11:15 AM [facility] NURSE [facility]  11/14/2022 11:30 AM [facility] US2 [facility]    Scheryl Darter, MD

## 2022-10-31 ENCOUNTER — Ambulatory Visit (HOSPITAL_BASED_OUTPATIENT_CLINIC_OR_DEPARTMENT_OTHER): Payer: BC Managed Care – PPO

## 2022-10-31 ENCOUNTER — Ambulatory Visit: Payer: BC Managed Care – PPO | Admitting: *Deleted

## 2022-10-31 ENCOUNTER — Other Ambulatory Visit: Payer: Self-pay

## 2022-10-31 ENCOUNTER — Encounter (HOSPITAL_COMMUNITY): Payer: Self-pay | Admitting: Obstetrics and Gynecology

## 2022-10-31 ENCOUNTER — Inpatient Hospital Stay (HOSPITAL_COMMUNITY)
Admission: AD | Admit: 2022-10-31 | Discharge: 2022-10-31 | Disposition: A | Discharge status: Home / Self Care | Payer: BC Managed Care – PPO | Attending: Obstetrics and Gynecology | Admitting: Obstetrics and Gynecology

## 2022-10-31 VITALS — BP 139/82 | HR 74

## 2022-10-31 DIAGNOSIS — O99213 Obesity complicating pregnancy, third trimester: Secondary | ICD-10-CM

## 2022-10-31 DIAGNOSIS — O10013 Pre-existing essential hypertension complicating pregnancy, third trimester: Secondary | ICD-10-CM

## 2022-10-31 DIAGNOSIS — O26613 Liver and biliary tract disorders in pregnancy, third trimester: Secondary | ICD-10-CM | POA: Insufficient documentation

## 2022-10-31 DIAGNOSIS — O24419 Gestational diabetes mellitus in pregnancy, unspecified control: Secondary | ICD-10-CM | POA: Insufficient documentation

## 2022-10-31 DIAGNOSIS — K828 Other specified diseases of gallbladder: Secondary | ICD-10-CM | POA: Insufficient documentation

## 2022-10-31 DIAGNOSIS — O10913 Unspecified pre-existing hypertension complicating pregnancy, third trimester: Secondary | ICD-10-CM

## 2022-10-31 DIAGNOSIS — E669 Obesity, unspecified: Secondary | ICD-10-CM | POA: Diagnosis not present

## 2022-10-31 DIAGNOSIS — Z3A35 35 weeks gestation of pregnancy: Secondary | ICD-10-CM

## 2022-10-31 DIAGNOSIS — Z3689 Encounter for other specified antenatal screening: Secondary | ICD-10-CM | POA: Diagnosis not present

## 2022-10-31 DIAGNOSIS — O24410 Gestational diabetes mellitus in pregnancy, diet controlled: Secondary | ICD-10-CM | POA: Diagnosis not present

## 2022-10-31 DIAGNOSIS — O10919 Unspecified pre-existing hypertension complicating pregnancy, unspecified trimester: Secondary | ICD-10-CM | POA: Insufficient documentation

## 2022-10-31 DIAGNOSIS — O0990 Supervision of high risk pregnancy, unspecified, unspecified trimester: Secondary | ICD-10-CM | POA: Insufficient documentation

## 2022-10-31 NOTE — MAU Provider Note (Signed)
History     CSN: [id number]  Arrival date and time: 10/31/22 1703   Event Date/Time   First Provider Initiated Contact with Patient 10/31/22 1803      Chief Complaint  Patient presents with   Fetal monitoring   HPI Deborah [name] is a 27 y.o. G1P0 at [redacted]w[redacted]d who presents to MAU from MFM for extended fetal monitoring. Patient seen in MFM with a BPP 8/8, however because she had a BPP 4/8 last week and NST was recommended today as well. Given that the office was closing, she was sent here. She reports normal fetal movement. She denies contractions, vaginal bleeding, or leaking fluid.   Pregnancy course: cHTN (Procardia), A1GDM, LGA, obesity. She receives [facility], next appointment is on 7/10. She is also scheduled for follow up at MFM on Monday 7/8.  OB History     Gravida  1   Para      Term      Preterm      AB      Living         SAB      IAB      Ectopic      Multiple      Live Births              Past Medical History:  Diagnosis Date   Elevated blood pressure reading - suspected cHTN 04/04/2022   Elevated BP at RN visit for UPT, no prior dx of HTN  Baseline P/C 0.176, serum labs WNL  BP check scheduled 12/14 for confirmation of cHTN diagnosis   Gestational diabetes mellitus, class A1 05/19/2022   Hypertension    Pre-diabetes 2024    Past Surgical History:  Procedure Laterality Date   MANDIBLE SURGERY Right 2018    History reviewed. No pertinent family history.  Social History   Tobacco Use   Smoking status: Never    Passive exposure: Never   Smokeless tobacco: Never  Vaping Use   Vaping Use: Never used  Substance Use Topics   Alcohol use: Not Currently   Drug use: Never    Allergies: No Known Allergies  No medications prior to admission.   Review of Systems  All other systems reviewed and are negative.   Physical Exam   Blood pressure 133/75, pulse 78, temperature 97.8 F (36.6 C), temperature source Oral, resp. rate 19,  height 5\' 7"  (1.702 m), weight (!) 150.2 kg, last menstrual period 02/11/2022, SpO2 100 %.  Physical Exam Vitals and nursing note reviewed.  Constitutional:      General: She is not in acute distress.    Appearance: She is obese.  Eyes:     Extraocular Movements: Extraocular movements intact.     Pupils: Pupils are equal, round, and reactive to light.  Cardiovascular:     Rate and Rhythm: Normal rate.  Pulmonary:     Effort: Pulmonary effort is normal.  Abdominal:     Palpations: Abdomen is soft.     Tenderness: There is no abdominal tenderness.     Comments: Gravid   Musculoskeletal:        General: Normal range of motion.     Cervical back: Normal range of motion.  Skin:    General: Skin is warm and dry.  Neurological:     General: No focal deficit present.     Mental Status: She is alert and oriented to person, place, and time.  Psychiatric:  Mood and Affect: Mood normal.        Behavior: Behavior normal.    NST FHR: 145 bpm, moderate variability, +15x15 accels, no decels Toco: quiet MAU Course  Procedures  MDM NST x1 hour  NST reactive and reassuring. Patient feeling normal fetal movement. She had a BPP 8/8 at MFM today.   Assessment and Plan   1. [redacted] weeks gestation of pregnancy   2. NST (non-stress test) reactive   3. Chronic hypertension during pregnancy (Procardia), antepartum   4. Gestational diabetes mellitus, class A1    - Discharge home in stable condition - Fetal kick counts and return precautions reviewed - Keep appointment at MFM as scheduled on 7/8  Brand Males, CNM 10/31/2022, 7:38 PM

## 2022-10-31 NOTE — MAU Note (Signed)
Deborah [name] is a 27 y.o. at [redacted]w[redacted]d here in MAU reporting: she was sent from [facility] office for EFM secondary decreased FM.  Denies LOF and VB.  Endorses +FM. LMP: NA Onset of complaint: today Pain score: 0 Vitals:   10/31/22 1744  BP: 136/64  Pulse: 85  Resp: 19  Temp: 97.8 F (36.6 C)  SpO2: 100%     FHT:146 bpm Lab orders placed from triage:   None

## 2022-11-03 ENCOUNTER — Ambulatory Visit: Payer: BC Managed Care – PPO | Admitting: *Deleted

## 2022-11-03 ENCOUNTER — Other Ambulatory Visit: Payer: Self-pay | Admitting: *Deleted

## 2022-11-03 ENCOUNTER — Ambulatory Visit: Payer: BC Managed Care – PPO | Attending: Obstetrics and Gynecology | Admitting: *Deleted

## 2022-11-03 VITALS — BP 118/69 | HR 87

## 2022-11-03 DIAGNOSIS — O0990 Supervision of high risk pregnancy, unspecified, unspecified trimester: Secondary | ICD-10-CM

## 2022-11-03 DIAGNOSIS — O99213 Obesity complicating pregnancy, third trimester: Secondary | ICD-10-CM | POA: Diagnosis not present

## 2022-11-03 DIAGNOSIS — Z3A35 35 weeks gestation of pregnancy: Secondary | ICD-10-CM | POA: Diagnosis not present

## 2022-11-03 DIAGNOSIS — O10919 Unspecified pre-existing hypertension complicating pregnancy, unspecified trimester: Secondary | ICD-10-CM

## 2022-11-03 DIAGNOSIS — O24419 Gestational diabetes mellitus in pregnancy, unspecified control: Secondary | ICD-10-CM

## 2022-11-03 DIAGNOSIS — O10913 Unspecified pre-existing hypertension complicating pregnancy, third trimester: Secondary | ICD-10-CM | POA: Diagnosis present

## 2022-11-03 NOTE — Procedures (Cosign Needed)
Deborah [name] April 27, 1996 [redacted]w[redacted]d  Fetus A Non-Stress Test Interpretation for 11/03/22  NST ONLY  Indication: Chronic Hypertenstion, GDM, Obese  Fetal Heart Rate A Mode: External Baseline Rate (A): 140 bpm Variability: Moderate Accelerations: 15 x 15 Decelerations: None Multiple birth?: No  Uterine Activity Mode: Palpation, Toco Contraction Frequency (min): 2 uc's Contraction Duration (sec): 60-80 Contraction Quality: Mild Resting Tone Palpated: Relaxed Resting Time: Adequate  Interpretation (Fetal Testing) Nonstress Test Interpretation: Reactive Overall Impression: Reassuring for gestational age Comments: Dr. Darra Lis reviewed tracing

## 2022-11-05 ENCOUNTER — Ambulatory Visit (INDEPENDENT_AMBULATORY_CARE_PROVIDER_SITE_OTHER): Payer: BC Managed Care – PPO | Admitting: Obstetrics and Gynecology

## 2022-11-05 ENCOUNTER — Other Ambulatory Visit: Payer: Self-pay

## 2022-11-05 ENCOUNTER — Other Ambulatory Visit (HOSPITAL_COMMUNITY)
Admission: RE | Admit: 2022-11-05 | Discharge: 2022-11-05 | Disposition: A | Discharge status: Home / Self Care | Payer: BC Managed Care – PPO | Source: Ambulatory Visit | Attending: Obstetrics and Gynecology | Admitting: Obstetrics and Gynecology

## 2022-11-05 VITALS — BP 134/83 | HR 97 | Wt 327.8 lb

## 2022-11-05 DIAGNOSIS — O0990 Supervision of high risk pregnancy, unspecified, unspecified trimester: Secondary | ICD-10-CM

## 2022-11-05 DIAGNOSIS — Z0289 Encounter for other administrative examinations: Secondary | ICD-10-CM

## 2022-11-05 DIAGNOSIS — O10919 Unspecified pre-existing hypertension complicating pregnancy, unspecified trimester: Secondary | ICD-10-CM

## 2022-11-05 DIAGNOSIS — O24410 Gestational diabetes mellitus in pregnancy, diet controlled: Secondary | ICD-10-CM

## 2022-11-05 DIAGNOSIS — O0993 Supervision of high risk pregnancy, unspecified, third trimester: Secondary | ICD-10-CM

## 2022-11-05 DIAGNOSIS — Z3A35 35 weeks gestation of pregnancy: Secondary | ICD-10-CM

## 2022-11-05 DIAGNOSIS — Z6841 Body Mass Index (BMI) 40.0 and over, adult: Secondary | ICD-10-CM

## 2022-11-05 DIAGNOSIS — O10913 Unspecified pre-existing hypertension complicating pregnancy, third trimester: Secondary | ICD-10-CM

## 2022-11-05 NOTE — Progress Notes (Signed)
   PRENATAL VISIT NOTE  Subjective:  Deborah [name] is a 27 y.o. G1P0 at [redacted]w[redacted]d being seen today for ongoing prenatal care.  She is currently monitored for the following issues for this high-risk pregnancy and has Elevated hemoglobin A1c; Chronic hypertension during pregnancy (Procardia), antepartum; Supervision of high risk pregnancy, antepartum; BMI 50.0-59.9, adult (HCC); and Gestational diabetes mellitus, class A1 on their problem list.  Patient doing well with no acute concerns today. She reports no complaints.  Contractions: Not present. Vag. Bleeding: None.  Movement: Present. Denies leaking of fluid.   The following portions of the patient's history were reviewed and updated as appropriate: allergies, current medications, past family history, past medical history, past social history, past surgical history and problem list. Problem list updated.  Objective:   Vitals:   11/05/22 1322 11/05/22 1326  BP: (!) 136/92 134/83  Pulse: 89 97  Weight: (!) 327 lb 12.8 oz (148.7 kg)     Fetal Status: Fetal Heart Rate (bpm): 150 Fundal Height: 40 cm Movement: Present     General:  Alert, oriented and cooperative. Patient is in no acute distress.  Skin: Skin is warm and dry. No rash noted.   Cardiovascular: Normal heart rate noted  Respiratory: Normal respiratory effort, no problems with respiration noted  Abdomen: Soft, gravid, appropriate for gestational age.  Pain/Pressure: Absent     Pelvic: Cervical exam performed Dilation: Closed Effacement (%): 20 Station: Ballotable  Extremities: Normal range of motion.  Edema: None  Mental Status:  Normal mood and affect. Normal behavior. Normal judgment and thought content.   Assessment and Plan:  Pregnancy: G1P0 at [redacted]w[redacted]d  1. [redacted] weeks gestation of pregnancy   2. Chronic hypertension during pregnancy (Procardia), antepartum BP borderline on current meds, BPP this week, IOL on 7/19  3. Gestational diabetes mellitus, class A1 FBS:  83-98 PPBS: 98-123 Decent control, fastings slightly elevated, due to imminent IOL will not convert to metformin or insulin  4. Supervision of high risk pregnancy, antepartum Induction of labor scheduled for 7/19 - Strep Gp B NAA - GC/Chlamydia probe amp ([place])not at [facility]  5. BMI 50.0-59.9, adult (HCC)   Preterm labor symptoms and general obstetric precautions including but not limited to vaginal bleeding, contractions, leaking of fluid and fetal movement were reviewed in detail with the patient.  Please refer to After Visit Summary for other counseling recommendations.   Return in about 1 week (around 11/12/2022) for [facility], in person.   Mariel Aloe, MD Faculty Attending Center for [facility]

## 2022-11-06 ENCOUNTER — Ambulatory Visit (HOSPITAL_BASED_OUTPATIENT_CLINIC_OR_DEPARTMENT_OTHER): Payer: BC Managed Care – PPO

## 2022-11-06 ENCOUNTER — Telehealth (HOSPITAL_COMMUNITY): Payer: Self-pay | Admitting: *Deleted

## 2022-11-06 ENCOUNTER — Inpatient Hospital Stay (HOSPITAL_COMMUNITY)
Admission: AD | Admit: 2022-11-06 | Discharge: 2022-11-06 | Disposition: A | Discharge status: Home / Self Care | Payer: BC Managed Care – PPO | Attending: Obstetrics & Gynecology | Admitting: Obstetrics & Gynecology

## 2022-11-06 ENCOUNTER — Ambulatory Visit: Payer: BC Managed Care – PPO

## 2022-11-06 ENCOUNTER — Other Ambulatory Visit: Payer: BC Managed Care – PPO

## 2022-11-06 ENCOUNTER — Other Ambulatory Visit: Payer: Self-pay | Admitting: Maternal & Fetal Medicine

## 2022-11-06 ENCOUNTER — Inpatient Hospital Stay (HOSPITAL_COMMUNITY): Payer: BC Managed Care – PPO

## 2022-11-06 ENCOUNTER — Encounter (HOSPITAL_COMMUNITY): Payer: Self-pay | Admitting: Obstetrics & Gynecology

## 2022-11-06 ENCOUNTER — Encounter: Payer: Self-pay | Admitting: General Practice

## 2022-11-06 ENCOUNTER — Inpatient Hospital Stay (HOSPITAL_BASED_OUTPATIENT_CLINIC_OR_DEPARTMENT_OTHER): Payer: BC Managed Care – PPO

## 2022-11-06 ENCOUNTER — Encounter (HOSPITAL_COMMUNITY): Payer: Self-pay | Admitting: *Deleted

## 2022-11-06 VITALS — BP 135/73 | HR 90

## 2022-11-06 DIAGNOSIS — O10919 Unspecified pre-existing hypertension complicating pregnancy, unspecified trimester: Secondary | ICD-10-CM

## 2022-11-06 DIAGNOSIS — O10913 Unspecified pre-existing hypertension complicating pregnancy, third trimester: Secondary | ICD-10-CM | POA: Insufficient documentation

## 2022-11-06 DIAGNOSIS — Z3689 Encounter for other specified antenatal screening: Secondary | ICD-10-CM | POA: Diagnosis not present

## 2022-11-06 DIAGNOSIS — O99213 Obesity complicating pregnancy, third trimester: Secondary | ICD-10-CM

## 2022-11-06 DIAGNOSIS — O0990 Supervision of high risk pregnancy, unspecified, unspecified trimester: Secondary | ICD-10-CM

## 2022-11-06 DIAGNOSIS — O24410 Gestational diabetes mellitus in pregnancy, diet controlled: Secondary | ICD-10-CM

## 2022-11-06 DIAGNOSIS — O10013 Pre-existing essential hypertension complicating pregnancy, third trimester: Secondary | ICD-10-CM

## 2022-11-06 DIAGNOSIS — Z3A35 35 weeks gestation of pregnancy: Secondary | ICD-10-CM

## 2022-11-06 DIAGNOSIS — E669 Obesity, unspecified: Secondary | ICD-10-CM

## 2022-11-06 DIAGNOSIS — O283 Abnormal ultrasonic finding on antenatal screening of mother: Secondary | ICD-10-CM | POA: Diagnosis not present

## 2022-11-06 DIAGNOSIS — O24419 Gestational diabetes mellitus in pregnancy, unspecified control: Secondary | ICD-10-CM

## 2022-11-06 DIAGNOSIS — O3663X0 Maternal care for excessive fetal growth, third trimester, not applicable or unspecified: Secondary | ICD-10-CM | POA: Insufficient documentation

## 2022-11-06 NOTE — MAU Provider Note (Addendum)
Chief Complaint:  Decreased Fetal Movement  HPI   Event Date/Time   First Provider Initiated Contact with Patient 11/06/22 1214     Deborah [name] is a 27 y.o. G1P0 at [redacted]w[redacted]d who presents to maternity admissions reporting decreased fetal movement this morning during her BPP (4/8), was sent here by MFM for 4-6hrs of monitoring and then a repeat BPP.   Pregnancy Course: Receives prenatal care at [facility], records reviewed. Has cHTN (procardia), A1GDM, LGA and obesity. Has IOL scheduled at 37wks.  Past Medical History:  Diagnosis Date   Elevated blood pressure reading - suspected cHTN 04/04/2022   Elevated BP at RN visit for UPT, no prior dx of HTN  Baseline P/C 0.176, serum labs WNL  BP check scheduled 12/14 for confirmation of cHTN diagnosis   Gestational diabetes mellitus, class A1 05/19/2022   Hypertension    Pre-diabetes 2024   OB History  Gravida Para Term Preterm AB Living  1            SAB IAB Ectopic Multiple Live Births               # Outcome Date GA Lbr Len/2nd Weight Sex Type Anes PTL Lv  1 Current            Past Surgical History:  Procedure Laterality Date   MANDIBLE SURGERY Right 2018   Family History  Problem Relation Age of Onset   Healthy Mother    Healthy Father    Social History   Tobacco Use   Smoking status: Never    Passive exposure: Never   Smokeless tobacco: Never  Vaping Use   Vaping status: Never Used  Substance Use Topics   Alcohol use: Not Currently   Drug use: Never   No Known Allergies Medications Prior to Admission  Medication Sig Dispense Refill Last Dose   aspirin 81 MG chewable tablet Chew 1 tablet (81 mg total) by mouth daily. 30 tablet 6 11/06/2022   NIFEdipine (ADALAT CC) 60 MG 24 hr tablet TAKE 1 TABLET BY MOUTH EVERY DAY 30 tablet 4 11/06/2022   prenatal vitamin w/FE, FA (PRENATAL 1 + 1) 27-1 MG TABS tablet Take 1 tablet by mouth daily at 12 noon.   11/06/2022   Accu-Chek Softclix Lancets lancets Use as instructed. QID 100  each 12    Blood Glucose Monitoring Suppl (ACCU-CHEK GUIDE ME) w/Device KIT USE AS DIRECTED 4 TIMES A DAY      glucose blood test strip Use as instructed. QID 100 each 12    I have reviewed patient's Past Medical Hx, Surgical Hx, Family Hx, Social Hx, medications and allergies.   ROS  Pertinent items noted in HPI and remainder of comprehensive ROS otherwise negative.   PHYSICAL EXAM  Patient Vitals for the past 24 hrs:  BP Temp Temp src Pulse Resp SpO2 Height Weight  11/06/22 1203 (!) 119/97 -- -- 78 -- -- -- --  11/06/22 1155 138/82 97.8 F (36.6 C) Oral 86 20 100 % 5\' 7"  (1.702 m) (!) 329 lb 1.6 oz (149.3 kg)   Constitutional: Well-developed, well-nourished female in no acute distress.  Cardiovascular: normal rate & rhythm, warm and well-perfused Respiratory: normal effort, no problems with respiration noted GI: Abd soft, non-tender, non-distended MS: Extremities nontender, no edema, normal ROM Neurologic: Alert and oriented x 4.  GU: no CVA tenderness Pelvic: exam deferred  Fetal Tracing: reactive Baseline: 145 Variability: moderate Accelerations: present Decelerations: none Toco: occasional, felt as mild pressure only  Labs: No results found for this or any previous visit (from the past 24 hour(s)).  Imaging:  [place] MFM FETAL BPP WO NON STRESS  Result Date: 11/06/2022 ----------------------------------------------------------------------  OBSTETRICS REPORT                       (Signed Final 11/06/2022 11:00 am) ---------------------------------------------------------------------- Patient Info  ID #:       [id number]                          D.O.B.:  [birth year] (27 yrs)  Name:       Deborah [name]                Visit Date: 11/06/2022 09:59 am              [facility] ---------------------------------------------------------------------- Performed By  Attending:        Noralee Space MD        Ref. Address:     [address]                                                              [place], [place]                                                             [address]  Performed By:     Kris Hartmann,      [facility]  Referred By:      [facility] ---------------------------------------------------------------------- Orders  #  Description                           Code        Ordered By  1  [place] MFM FETAL BPP WO NON  16109.60    [facility]     STRESS ----------------------------------------------------------------------  #  Order #                     Accession #                Episode #  1  [id number]                   [id number]                 [id number] ---------------------------------------------------------------------- Indications  [redacted] weeks gestation of pregnancy                Z3A.35  Gestational diabetes in pregnancy, diet        O24.410  controlled  Hypertension - Chronic/Pre-existing            O10.019  (Procardia)  Obesity complicating pregnancy, third          O99.213  trimester (BMI 50) ---------------------------------------------------------------------- Fetal Evaluation  Num Of Fetuses:         1  Fetal Heart Rate(bpm):  155  Cardiac Activity:       Observed  Presentation:           Cephalic  Placenta:               Posterior  P. Cord Insertion:      Previously visualized  Amniotic Fluid  AFI FV:      Within normal limits  AFI Sum(cm)     %Tile       Largest Pocket(cm)  19.08           71          5.4  RUQ(cm)       RLQ(cm)       LUQ(cm)        LLQ(cm)  5.4           4.85          5.23           3.6 ---------------------------------------------------------------------- Biophysical Evaluation  Amniotic F.V:   Within normal limits       F. Tone:        Not Observed  F. Movement:    Not Observed                Score:          4/8  F. Breathing:   Observed ---------------------------------------------------------------------- Biometry  LV:        4.8  mm ---------------------------------------------------------------------- OB History  Gravidity:    1         Term:   0        Prem:   0        SAB:   0  TOP:          0       Ectopic:  0        Living: 0 ---------------------------------------------------------------------- Gestational Age  LMP:           38w 2d        Date:  02/11/22                 EDD:   11/18/22  Best:          Consuello Closs 6d     Det. By:  U/S  (06/10/22)          EDD:   12/05/22 ---------------------------------------------------------------------- Anatomy  Cranium:  Appears normal         Aortic Arch:            Previously seen  Cavum:                 Previously seen        Ductal Arch:            Previously seen  Ventricles:            Appears normal         Diaphragm:              Appears normal  Choroid Plexus:        Previously seen        Stomach:                Appears normal, left                                                                        sided  Cerebellum:            Previously seen        Abdomen:                Appears normal  Posterior Fossa:       Previously seen        Abdominal Wall:         Previously seen  Nuchal Fold:           Previously seen        Cord Vessels:           Previously seen  Face:                  Orbits and profile     Kidneys:                Appear normal, RK                         previously seen                                = 6 mm  Lips:                  Previously seen        Bladder:                Appears normal  Thoracic:              Appears normal         Spine:                  Previously seen  Heart:                 Previously seen        Upper Extremities:      Previously seen  RVOT:                  Previously seen        Lower Extremities:      Previously seen  LVOT:  Previously seen  Other:  SVC/IVC, 3VV, 3VTV  previously visualized. Hands and feet          previously visualized. Heels previously seen. Technically difficult due          to maternal habitus. ---------------------------------------------------------------------- Impression  Gestational diabetes.  Reportedly well-controlled on diet.  Chronic hypertension.  Patient takes nifedipine.  Blood  pressure today at our office is 135/73 mmHg.  Amniotic fluid is normal.  Fetal fetal movements and tone did  not meet the criteria of BPP.  Fetal breathing movements  were seen.  BPP 4/8.  I explained the poor biophysical profile score and  recommended evaluation to the MAU for NST.  BPP to be  repeated in 4 to 6 hours.  Called MAU for handover. ---------------------------------------------------------------------- Recommendations  -If repeat BPP or NST is not reassuring, delivery should be  considered.  -If BPP and NST are reassuring, patient can have induction  of labor next week.  NST at her prenatal visit on 11/11/2022. ----------------------------------------------------------------------                 Noralee Space, MD Electronically Signed Final Report   11/06/2022 11:00 am ----------------------------------------------------------------------    MDM & MAU COURSE  MDM:  MAU Course: NST reactive, will monitor the 4hrs if it remains active and order repeat BPP. Care turned over to Camelia Eng, CNM at 2:13 PM Edd Arbour, CNM, MSN, IBCLC   Extended NST >5 hours. Preliminary [place] report reviewed by me, BPP 8/8 and NST reactive for a total 10/10. Patient reports feeling normal fetal movement.   ASSESSMENT   1. [redacted] weeks gestation of pregnancy   2. NST (non-stress test) reactive   3. Chronic hypertension affecting pregnancy   4. Gestational diabetes mellitus, class A1    PLAN   - Discharge home in stable condition - Fetal kick counts and return precautions reviewed - Return to MAU as needed - Keep OB appointment as scheduled on  7/16   Brand Males, CNM 11/06/22, 5:50 PM

## 2022-11-06 NOTE — Telephone Encounter (Signed)
Preadmission screen  

## 2022-11-06 NOTE — MAU Note (Signed)
Deborah [name] is a 27 y.o. at [redacted]w[redacted]d here in MAU reporting: pt sent from MFM (diab/[facility]TN).  Bpp today 4/8.  Started feeling more movement when left office.  Denies pain, bleeding or LOF.  Onset of complaint: today Pain score: none Vitals:   11/06/22 1155  BP: 138/82  Pulse: 86  Resp: 20  Temp: 97.8 F (36.6 C)  SpO2: 100%     FHT:150 Lab orders placed from triage:

## 2022-11-07 ENCOUNTER — Ambulatory Visit (INDEPENDENT_AMBULATORY_CARE_PROVIDER_SITE_OTHER): Payer: Self-pay | Admitting: Pediatrics

## 2022-11-07 DIAGNOSIS — Z7681 Expectant parent(s) prebirth pediatrician visit: Secondary | ICD-10-CM

## 2022-11-07 LAB — GC/CHLAMYDIA PROBE AMP (~~LOC~~) NOT AT ARMC
Chlamydia: NEGATIVE
Comment: NEGATIVE
Comment: NORMAL
Neisseria Gonorrhea: NEGATIVE

## 2022-11-07 NOTE — Progress Notes (Signed)
Prenatal counseling for impending newborn done-- Z76.81  

## 2022-11-08 LAB — STREP GP B NAA: Strep Gp B NAA: NEGATIVE

## 2022-11-10 ENCOUNTER — Encounter: Payer: Self-pay | Admitting: Obstetrics & Gynecology

## 2022-11-11 ENCOUNTER — Other Ambulatory Visit: Payer: Self-pay

## 2022-11-11 ENCOUNTER — Ambulatory Visit (INDEPENDENT_AMBULATORY_CARE_PROVIDER_SITE_OTHER): Payer: BC Managed Care – PPO | Admitting: Obstetrics & Gynecology

## 2022-11-11 VITALS — BP 107/75 | HR 83 | Wt 329.9 lb

## 2022-11-11 DIAGNOSIS — O10913 Unspecified pre-existing hypertension complicating pregnancy, third trimester: Secondary | ICD-10-CM

## 2022-11-11 DIAGNOSIS — R7309 Other abnormal glucose: Secondary | ICD-10-CM

## 2022-11-11 DIAGNOSIS — O24410 Gestational diabetes mellitus in pregnancy, diet controlled: Secondary | ICD-10-CM

## 2022-11-11 DIAGNOSIS — Z6841 Body Mass Index (BMI) 40.0 and over, adult: Secondary | ICD-10-CM

## 2022-11-11 DIAGNOSIS — O10919 Unspecified pre-existing hypertension complicating pregnancy, unspecified trimester: Secondary | ICD-10-CM

## 2022-11-11 DIAGNOSIS — O0993 Supervision of high risk pregnancy, unspecified, third trimester: Secondary | ICD-10-CM

## 2022-11-11 DIAGNOSIS — O0990 Supervision of high risk pregnancy, unspecified, unspecified trimester: Secondary | ICD-10-CM

## 2022-11-11 DIAGNOSIS — Z3A36 36 weeks gestation of pregnancy: Secondary | ICD-10-CM

## 2022-11-11 DIAGNOSIS — Z0289 Encounter for other administrative examinations: Secondary | ICD-10-CM

## 2022-11-11 NOTE — Progress Notes (Signed)
   PRENATAL VISIT NOTE  Subjective:  Deborah [name] is a 27 y.o. G1P0 at [redacted]w[redacted]d being seen today for ongoing prenatal care.  She is currently monitored for the following issues for this high-risk pregnancy and has Elevated hemoglobin A1c; Chronic hypertension during pregnancy (Procardia), antepartum; Supervision of high risk pregnancy, antepartum; BMI 50.0-59.9, adult (HCC); and Gestational diabetes mellitus, class A1 on their problem list.  Patient reports no complaints.  Contractions: Not present. Vag. Bleeding: None.  Movement: Present. Denies leaking of fluid.   The following portions of the patient's history were reviewed and updated as appropriate: allergies, current medications, past family history, past medical history, past social history, past surgical history and problem list.   Objective:   Vitals:   11/11/22 1611  BP: 107/75  Pulse: 83  Weight: (!) 329 lb 14.4 oz (149.6 kg)    Fetal Status: Fetal Heart Rate (bpm): 155   Movement: Present     General:  Alert, oriented and cooperative. Patient is in no acute distress.  Skin: Skin is warm and dry. No rash noted.   Cardiovascular: Normal heart rate noted  Respiratory: Normal respiratory effort, no problems with respiration noted  Abdomen: Soft, gravid, appropriate for gestational age.  Pain/Pressure: Absent     Pelvic: Cervical exam deferred        Extremities: Normal range of motion.  Edema: None  Mental Status: Normal mood and affect. Normal behavior. Normal judgment and thought content.   Assessment and Plan:  Pregnancy: G1P0 at [redacted]w[redacted]d 1. Elevated hemoglobin A1c   2. Chronic hypertension during pregnancy (Procardia), antepartum Bp well controlled  3. Gestational diabetes mellitus, class A1 Diet control  4. BMI 50.0-59.9, adult (HCC)   5. Supervision of high risk pregnancy, antepartum IOL 37 weeks  Preterm labor symptoms and general obstetric precautions including but not limited to vaginal bleeding,  contractions, leaking of fluid and fetal movement were reviewed in detail with the patient. Please refer to After Visit Summary for other counseling recommendations.   Return if symptoms worsen or fail to improve.  Future Appointments  Date Time Provider Department Center  11/14/2022  6:45 AM [facility] SCHED ROOM [facility] None    Scheryl Darter, MD

## 2022-11-14 ENCOUNTER — Other Ambulatory Visit: Payer: Self-pay | Admitting: Family Medicine

## 2022-11-14 ENCOUNTER — Other Ambulatory Visit: Payer: Self-pay

## 2022-11-14 ENCOUNTER — Encounter (HOSPITAL_COMMUNITY): Payer: Self-pay | Admitting: Family Medicine

## 2022-11-14 ENCOUNTER — Inpatient Hospital Stay (HOSPITAL_COMMUNITY): Payer: BC Managed Care – PPO

## 2022-11-14 ENCOUNTER — Inpatient Hospital Stay (HOSPITAL_COMMUNITY)
Admission: RE | Admit: 2022-11-14 | Discharge: 2022-11-18 | DRG: 807 | Disposition: A | Discharge status: Home / Self Care | Payer: BC Managed Care – PPO | Attending: Obstetrics and Gynecology | Admitting: Obstetrics and Gynecology

## 2022-11-14 ENCOUNTER — Ambulatory Visit: Payer: BC Managed Care – PPO

## 2022-11-14 DIAGNOSIS — Z6841 Body Mass Index (BMI) 40.0 and over, adult: Secondary | ICD-10-CM

## 2022-11-14 DIAGNOSIS — Z3A37 37 weeks gestation of pregnancy: Secondary | ICD-10-CM | POA: Diagnosis not present

## 2022-11-14 DIAGNOSIS — O0990 Supervision of high risk pregnancy, unspecified, unspecified trimester: Secondary | ICD-10-CM

## 2022-11-14 DIAGNOSIS — R7309 Other abnormal glucose: Secondary | ICD-10-CM | POA: Diagnosis present

## 2022-11-14 DIAGNOSIS — O1002 Pre-existing essential hypertension complicating childbirth: Secondary | ICD-10-CM | POA: Diagnosis not present

## 2022-11-14 DIAGNOSIS — O1092 Unspecified pre-existing hypertension complicating childbirth: Secondary | ICD-10-CM | POA: Diagnosis present

## 2022-11-14 DIAGNOSIS — O24420 Gestational diabetes mellitus in childbirth, diet controlled: Secondary | ICD-10-CM | POA: Diagnosis present

## 2022-11-14 DIAGNOSIS — Z7982 Long term (current) use of aspirin: Secondary | ICD-10-CM

## 2022-11-14 DIAGNOSIS — O10919 Unspecified pre-existing hypertension complicating pregnancy, unspecified trimester: Secondary | ICD-10-CM | POA: Diagnosis present

## 2022-11-14 DIAGNOSIS — O99893 Other specified diseases and conditions complicating puerperium: Secondary | ICD-10-CM | POA: Diagnosis not present

## 2022-11-14 DIAGNOSIS — O99214 Obesity complicating childbirth: Secondary | ICD-10-CM | POA: Diagnosis present

## 2022-11-14 DIAGNOSIS — R200 Anesthesia of skin: Secondary | ICD-10-CM | POA: Diagnosis not present

## 2022-11-14 DIAGNOSIS — O3663X0 Maternal care for excessive fetal growth, third trimester, not applicable or unspecified: Secondary | ICD-10-CM | POA: Diagnosis present

## 2022-11-14 DIAGNOSIS — O24410 Gestational diabetes mellitus in pregnancy, diet controlled: Secondary | ICD-10-CM | POA: Diagnosis present

## 2022-11-14 LAB — CBC
HCT: 41.1 % (ref 36.0–46.0)
Hemoglobin: 13.3 g/dL (ref 12.0–15.0)
MCH: 27.1 pg (ref 26.0–34.0)
MCHC: 32.4 g/dL (ref 30.0–36.0)
MCV: 83.7 fL (ref 80.0–100.0)
Platelets: 359 10*3/uL (ref 150–400)
RBC: 4.91 MIL/uL (ref 3.87–5.11)
RDW: 13.2 % (ref 11.5–15.5)
WBC: 10.9 10*3/uL — ABNORMAL HIGH (ref 4.0–10.5)
nRBC: 0 % (ref 0.0–0.2)

## 2022-11-14 LAB — TYPE AND SCREEN

## 2022-11-14 LAB — GLUCOSE, CAPILLARY: Glucose-Capillary: 155 mg/dL — ABNORMAL HIGH (ref 70–99)

## 2022-11-14 MED ORDER — SOD CITRATE-CITRIC ACID 500-334 MG/5ML PO SOLN: 30.0000 mL | ORAL | Status: DC | PRN

## 2022-11-14 MED ORDER — ACETAMINOPHEN 325 MG PO TABS: 650.0000 mg | ORAL_TABLET | ORAL | Status: DC | PRN

## 2022-11-14 MED ORDER — LACTATED RINGERS IV SOLN: INTRAVENOUS | Status: DC

## 2022-11-14 MED ORDER — OXYCODONE-ACETAMINOPHEN 5-325 MG PO TABS: 1.0000 | ORAL_TABLET | ORAL | Status: DC | PRN

## 2022-11-14 MED ORDER — OXYTOCIN BOLUS FROM INFUSION
333.0000 mL | Freq: Once | INTRAVENOUS | Status: AC
  Administered 2022-11-16: 333 mL via INTRAVENOUS

## 2022-11-14 MED ORDER — ONDANSETRON HCL 4 MG/2ML IJ SOLN
4.0000 mg | Freq: Four times a day (QID) | INTRAMUSCULAR | Status: DC | PRN
  Administered 2022-11-16: 4 mg via INTRAVENOUS
  Filled 2022-11-14: qty 2

## 2022-11-14 MED ORDER — OXYTOCIN-SODIUM CHLORIDE 30-0.9 UT/500ML-% IV SOLN
2.5000 [IU]/h | INTRAVENOUS | Status: DC
  Administered 2022-11-16: 2.5 [IU]/h via INTRAVENOUS
  Filled 2022-11-14: qty 500

## 2022-11-14 MED ORDER — OXYCODONE-ACETAMINOPHEN 5-325 MG PO TABS: 2.0000 | ORAL_TABLET | ORAL | Status: DC | PRN

## 2022-11-14 MED ORDER — LACTATED RINGERS IV SOLN: 500.0000 mL | INTRAVENOUS | Status: DC | PRN

## 2022-11-14 MED ORDER — LIDOCAINE HCL (PF) 1 % IJ SOLN: 30.0000 mL | INTRAMUSCULAR | Status: DC | PRN

## 2022-11-15 ENCOUNTER — Inpatient Hospital Stay (HOSPITAL_COMMUNITY): Payer: BC Managed Care – PPO | Admitting: Anesthesiology

## 2022-11-15 LAB — CBC
HCT: 40.5 % (ref 36.0–46.0)
Hemoglobin: 12.9 g/dL (ref 12.0–15.0)
MCH: 27.3 pg (ref 26.0–34.0)
MCHC: 31.9 g/dL (ref 30.0–36.0)
MCV: 85.6 fL (ref 80.0–100.0)
Platelets: 301 10*3/uL (ref 150–400)
RBC: 4.73 MIL/uL (ref 3.87–5.11)
RDW: 13.3 % (ref 11.5–15.5)
WBC: 13.5 10*3/uL — ABNORMAL HIGH (ref 4.0–10.5)
nRBC: 0 % (ref 0.0–0.2)

## 2022-11-15 LAB — PROTEIN / CREATININE RATIO, URINE
Creatinine, Urine: 146 mg/dL
Protein Creatinine Ratio: 0.16 mg/mg{Cre} — ABNORMAL HIGH (ref 0.00–0.15)
Total Protein, Urine: 24 mg/dL

## 2022-11-15 LAB — RPR: RPR Ser Ql: NONREACTIVE

## 2022-11-15 LAB — GLUCOSE, CAPILLARY
Glucose-Capillary: 114 mg/dL — ABNORMAL HIGH (ref 70–99)
Glucose-Capillary: 103 mg/dL — ABNORMAL HIGH (ref 70–99)
Glucose-Capillary: 82 mg/dL (ref 70–99)
Glucose-Capillary: 93 mg/dL (ref 70–99)
Glucose-Capillary: 71 mg/dL (ref 70–99)

## 2022-11-15 LAB — TYPE AND SCREEN

## 2022-11-15 MED ORDER — PHENYLEPHRINE 80 MCG/ML (10ML) SYRINGE FOR IV PUSH (FOR BLOOD PRESSURE SUPPORT): 80.0000 ug | PREFILLED_SYRINGE | INTRAVENOUS | Status: DC | PRN

## 2022-11-15 MED ORDER — TERBUTALINE SULFATE 1 MG/ML IJ SOLN: 0.2500 mg | Freq: Once | INTRAMUSCULAR | Status: DC | PRN

## 2022-11-15 MED ORDER — FENTANYL-BUPIVACAINE-NACL 0.5-0.125-0.9 MG/250ML-% EP SOLN
EPIDURAL | Status: DC | PRN
  Administered 2022-11-15: 12 mL/h via EPIDURAL
  Administered 2022-11-16 (×2): 500 ug via EPIDURAL

## 2022-11-15 MED ORDER — MISOPROSTOL 25 MCG QUARTER TABLET
25.0000 ug | ORAL_TABLET | Freq: Once | ORAL | Status: AC
  Administered 2022-11-15: 25 ug via VAGINAL
  Filled 2022-11-15: qty 1

## 2022-11-15 MED ORDER — LIDOCAINE HCL (PF) 1 % IJ SOLN
INTRAMUSCULAR | Status: DC | PRN
  Administered 2022-11-15: 2 mL via EPIDURAL
  Administered 2022-11-15: 10 mL via EPIDURAL

## 2022-11-15 MED ORDER — EPHEDRINE 5 MG/ML INJ: 10.0000 mg | INTRAVENOUS | Status: DC | PRN

## 2022-11-15 MED ORDER — DIPHENHYDRAMINE HCL 50 MG/ML IJ SOLN: 12.5000 mg | INTRAMUSCULAR | Status: DC | PRN

## 2022-11-15 MED ORDER — MISOPROSTOL 25 MCG QUARTER TABLET
25.0000 ug | ORAL_TABLET | ORAL | Status: DC | PRN
  Administered 2022-11-15 (×2): 25 ug via VAGINAL

## 2022-11-15 MED ORDER — MISOPROSTOL 50MCG HALF TABLET
50.0000 ug | ORAL_TABLET | Freq: Once | ORAL | Status: AC
  Administered 2022-11-15: 50 ug via ORAL
  Filled 2022-11-15: qty 1

## 2022-11-15 MED ORDER — FENTANYL-BUPIVACAINE-NACL 0.5-0.125-0.9 MG/250ML-% EP SOLN
12.0000 mL/h | EPIDURAL | Status: DC | PRN
  Filled 2022-11-15 (×3): qty 250

## 2022-11-15 MED ORDER — MISOPROSTOL 25 MCG QUARTER TABLET
ORAL_TABLET | ORAL | Status: AC
  Filled 2022-11-15: qty 1

## 2022-11-15 MED ORDER — LACTATED RINGERS IV SOLN
500.0000 mL | Freq: Once | INTRAVENOUS | Status: AC
  Administered 2022-11-15: 500 mL via INTRAVENOUS

## 2022-11-15 MED ORDER — OXYTOCIN-SODIUM CHLORIDE 30-0.9 UT/500ML-% IV SOLN
1.0000 m[IU]/min | INTRAVENOUS | Status: DC
  Administered 2022-11-15: 2 m[IU]/min via INTRAVENOUS
  Filled 2022-11-15: qty 500

## 2022-11-15 MED ORDER — NIFEDIPINE ER OSMOTIC RELEASE 30 MG PO TB24
60.0000 mg | ORAL_TABLET | Freq: Every day | ORAL | Status: DC
  Administered 2022-11-15 – 2022-11-18 (×4): 60 mg via ORAL
  Filled 2022-11-15 (×4): qty 2

## 2022-11-15 NOTE — Progress Notes (Signed)
Labor Progress Note  Deborah [name] is a 27 y.o. G1P0 at [redacted]w[redacted]d presented for IOL cHTN.   S: Patient is doing well, not having too much discomfort.   O:  BP (!) 148/99   Pulse 61   Temp 98.6 F (37 C) (Oral)   Resp 16   Ht 5\' 7"  (1.702 m)   Wt (!) 151.5 kg   LMP 02/11/2022   BMI 52.30 kg/m  EFM:145 bpm/Moderate variability/ 15x15 accels/ None decels CAT: 1 Toco: contracting every 3-4 minutes   CVE: Dilation: 1 Effacement (%): Thick Cervical Position: Posterior Station: Ballotable Presentation: Vertex Exam by:: Alethia Berthold RN   A&P: 27 y.o. G1P0 [redacted]w[redacted]d  here for IOL.   #Labor: Discussed with patient option of placing foley balloon, which patient declines. She just received her 3rd  round of vaginal cytotec, will re-evaluate need for foley with next check.  #Pain: Family/Friend support #FWB: CAT 1 #GBS negative #GDM: CBG stable this morning, will continue to monitor.   Glee Arvin, MD FM PGY-3 11/15/22  9:22 AM

## 2022-11-15 NOTE — Anesthesia Preprocedure Evaluation (Signed)
Anesthesia Evaluation  Patient identified by MRN, date of birth, ID band Patient awake    Reviewed: Allergy & Precautions, Patient's Chart, lab work & pertinent test results  Airway Mallampati: III  TM Distance: >3 FB Neck ROM: Full    Dental no notable dental hx.    Pulmonary neg pulmonary ROS   Pulmonary exam normal breath sounds clear to auscultation       Cardiovascular hypertension (gHTN), Pt. on medications Normal cardiovascular exam Rhythm:Regular Rate:Normal     Neuro/Psych negative neurological ROS  negative psych ROS   GI/Hepatic negative GI ROS, Neg liver ROS,,,  Endo/Other  diabetes, Well Controlled, Gestational  Morbid obesityBMI 52  Renal/GU negative Renal ROS  negative genitourinary   Musculoskeletal negative musculoskeletal ROS (+)    Abdominal  (+) + obese  Peds negative pediatric ROS (+)  Hematology negative hematology ROS (+) Hb 12.9, plt 301   Anesthesia Other Findings   Reproductive/Obstetrics (+) Pregnancy                             Anesthesia Physical Anesthesia Plan  ASA: 3  Anesthesia Plan: Epidural   Post-op Pain Management:    Induction:   PONV Risk Score and Plan: 2  Airway Management Planned: Natural Airway  Additional Equipment: None  Intra-op Plan:   Post-operative Plan:   Informed Consent: I have reviewed the patients History and Physical, chart, labs and discussed the procedure including the risks, benefits and alternatives for the proposed anesthesia with the patient or authorized representative who has indicated his/her understanding and acceptance.       Plan Discussed with:   Anesthesia Plan Comments:        Anesthesia Quick Evaluation

## 2022-11-15 NOTE — Progress Notes (Signed)
Labor Progress Note  Deborah [name] is a 27 y.o. G1P0 at [redacted]w[redacted]d presented for IOL cHTN.   S: Patient currently doing well.  No concerns.  O:  BP (!) 153/84   Pulse 61   Temp 98.6 F (37 C) (Oral)   Resp 18   Ht 5\' 7"  (1.702 m)   Wt (!) 151.5 kg   LMP 02/11/2022   BMI 52.30 kg/m  EFM:145 bpm/Moderate variability/ 15x15 accels/ None decels CAT: 1 Toco: contracting every 3-4 minutes   CVE: Dilation: 1.5 Effacement (%): 70 Cervical Position: Posterior Station: -2 Presentation: Vertex Exam by:: Alethia Berthold RN   A&P: 27 y.o. G1P0 [redacted]w[redacted]d  here for IOL.   #Labor: Foley balloon placed #Pain: Family/Friend support #FWB: CAT 1 #GBS negative #GDM: CBG stable this morning, will continue to monitor.   Celedonio Savage, MD 11/15/22  1:43 PM

## 2022-11-15 NOTE — Progress Notes (Addendum)
Labor Progress Note Deborah [name] is a 27 y.o. G1P0 at [redacted]w[redacted]d presented for IOL cHTN, A1GDM, LGA S: Pt doing well, comfortable, anticipating baby  O:  BP 122/70   Pulse 74   Temp 97.9 F (36.6 C) (Oral)   Resp 18   Ht 5\' 7"  (1.702 m)   Wt (!) 151.5 kg   LMP 02/11/2022   SpO2 100%   BMI 52.30 kg/m  EFM: 145/moderate variability/15x15 accels/no decels  CVE: Dilation: 4 Effacement (%): 70 Cervical Position: Posterior Station: -3 Presentation: Vertex Exam by:: Alethia Berthold, RN   A&P: 27 y.o. G1P0 [redacted]w[redacted]d here for IOL #Labor: Progressing well. Foley balloon was placed earlier this afternoon and is out. AROM clear 2125  #Pain: epidural #FWB: cat 1 #GBS negative A1GDM: CBG 103 this morning, has been stable since admission, CTM cHTN: most recent 122/70, has had some elevated readings prior 140s/80s, continue procardia  Ahijah Devery, DO 9:26 PM

## 2022-11-15 NOTE — Anesthesia Procedure Notes (Signed)
Epidural Patient location during procedure: OB Start time: 11/15/2022 1:56 PM End time: 11/15/2022 2:07 PM  Staffing Anesthesiologist: Lannie Fields, DO Performed: anesthesiologist   Preanesthetic Checklist Completed: patient identified, IV checked, risks and benefits discussed, monitors and equipment checked, pre-op evaluation and timeout performed  Epidural Patient position: sitting Prep: DuraPrep and site prepped and draped Patient monitoring: continuous pulse ox, blood pressure, heart rate and cardiac monitor Approach: midline Location: L3-L4 Injection technique: LOR air  Needle:  Needle type: Tuohy  Needle gauge: 17 G Needle length: 9 cm Needle insertion depth: 8 cm Catheter type: closed end flexible Catheter size: 19 Gauge Catheter at skin depth: 14 cm Test dose: negative  Assessment Sensory level: T8 Events: blood not aspirated, no cerebrospinal fluid, injection not painful, no injection resistance, no paresthesia and negative IV test  Additional Notes Patient identified. Risks/Benefits/Options discussed with patient including but not limited to bleeding, infection, nerve damage, paralysis, failed block, incomplete pain control, headache, blood pressure changes, nausea, vomiting, reactions to medication both or allergic, itching and postpartum back pain. Confirmed with bedside nurse the patient's most recent platelet count. Confirmed with patient that they are not currently taking any anticoagulation, have any bleeding history or any family history of bleeding disorders. Patient expressed understanding and wished to proceed. All questions were answered. Sterile technique was used throughout the entire procedure. Please see nursing notes for vital signs. Test dose was given through epidural catheter and negative prior to continuing to dose epidural or start infusion. Warning signs of high block given to the patient including shortness of breath, tingling/numbness in  hands, complete motor block, or any concerning symptoms with instructions to call for help. Patient was given instructions on fall risk and not to get out of bed. All questions and concerns addressed with instructions to call with any issues or inadequate analgesia.  Reason for block:procedure for pain

## 2022-11-15 NOTE — Inpatient Diabetes Management (Signed)
Inpatient Diabetes Program Recommendations  Diabetes Treatment Program Recommendations  ADA Standards of Care Diabetes in Pregnancy Target Glucose Ranges:  Fasting: 70 - 95 mg/dL 1 hr postprandial: Less than 140mg /dL (from first bite of meal) 2 hr postprandial: Less than 120 mg/dL (from first bite of meal)     Latest Reference Range & Units 11/14/22 23:56 11/15/22 04:08  Glucose-Capillary 70 - 99 mg/dL 829 (H) 562 (H)   Review of Glycemic Control  Diabetes history: GDM; [redacted]W[redacted]D Outpatient Diabetes medications: None; diet controlled Current orders for Inpatient glycemic control: CBGs Q4H  Inpatient Diabetes Program Recommendations:    Insulin: Please consider using Diabetes Treatment for Pregnant/Postpartum Patients order set to order Novolog 0-14 units Q4H. If CBGs are over 120 mg/dl with Novolog correction, please consider switching to IV insulin per [employer].  Thanks, Orlando Penner, RN, MSN, CDCES Diabetes Coordinator Inpatient Diabetes Program [phone] (Team Pager from 8am to 5pm)

## 2022-11-15 NOTE — H&P (Signed)
OBSTETRIC ADMISSION HISTORY AND PHYSICAL  Deborah [name] is a 27 y.o. female G1P0 with IUP at [redacted]w[redacted]d by 14-week ultrasound presenting for IOL CHTN, A1 GDM, LGA. She reports +FMs, No LOF, no VB, no blurry vision, headaches or peripheral edema, and RUQ pain.  She plans on breast feeding. She request OCPs for birth control. She received her prenatal care at [facility]   Dating: By ultrasound --->  Estimated Date of Delivery: 12/05/22  Sono:   @34  w 0 d, CWD, normal anatomy, cephalic presentation, 2905 g, 96% EFW   Prenatal History/Complications:  Patient Active Problem List   Diagnosis Date Noted   Indication for care in labor or delivery 11/14/2022   BMI 50.0-59.9, adult (HCC) 05/19/2022   Gestational diabetes mellitus, class A1 05/19/2022   Chronic hypertension during pregnancy (Procardia), antepartum 04/30/2022   Supervision of high risk pregnancy, antepartum 04/30/2022   Elevated hemoglobin A1c 04/04/2022    Nursing Staff Provider  Office Location [facility] Dating  11/18/2022, Date entered prior to episode creation  [facility] Model [ x] Traditional [ ]  Centering [ ]  Mom-Baby Dyad    Language  English Anatomy US  Scheduled 06/24/22  Flu Vaccine   05/19/22 Genetic/Carrier Screen  NIPS:   low risk female AFP:   normal Horizon: Nml  TDaP Vaccine  09/15/22 Hgb A1C or  GTT Early Failed Suspect Type 2 Third trimester   COVID Vaccine Pfizer- 2 doses   LAB RESULTS   Rhogam  --/--/PENDING (07/19 2339)  Blood Type --/--/PENDING (07/19 2339)   Baby Feeding Plan Breast Antibody PENDING (07/19 2339)  Contraception Undecided--maybe OCPs  Rubella 2.02 (12/07 1429)  Circumcision Yes RPR Non Reactive (05/20 1003)   Pediatrician  List given HBsAg Negative (12/07 1429)   Support Person Luis(FOB) HCVAb Non Reactive (12/07 1429)   Prenatal Classes  HIV Non Reactive (05/20 1003)     BTL Consent NA GBS Negative/-- (07/10 1422)  negative (For PCN allergy, check sensitivities)   VBAC Consent NA Pap  Diagnosis  Date Value Ref Range Status  05/19/2022   Final   - Negative for intraepithelial lesion or malignancy (NILM)         DME Rx [ X] BP cuff [ ]  Weight Scale Waterbirth  [ ]  Class [ ]  Consent [ ]  CNM visit  PHQ9 & GAD7 [  ] new OB [  ] 28 weeks  [  ] 36 weeks Induction  [ ]  Orders Entered [ ] Foley Y/N     Past Medical History: Past Medical History:  Diagnosis Date   Elevated blood pressure reading - suspected cHTN 04/04/2022   Elevated BP at RN visit for UPT, no prior dx of HTN  Baseline P/C 0.176, serum labs WNL  BP check scheduled 12/14 for confirmation of cHTN diagnosis   Gestational diabetes mellitus, class A1 05/19/2022   Hypertension    Pre-diabetes 2024    Past Surgical History: Past Surgical History:  Procedure Laterality Date   MANDIBLE SURGERY Right 2018    Obstetrical History: OB History     Gravida  1   Para      Term      Preterm      AB      Living         SAB      IAB      Ectopic      Multiple      Live Births  Social History Social History   Socioeconomic History   Marital status: Single    Spouse name: Not on file   Number of children: Not on file   Years of education: Not on file   Highest education level: Some college, no degree  Occupational History   Not on file  Tobacco Use   Smoking status: Never    Passive exposure: Never   Smokeless tobacco: Never  Vaping Use   Vaping status: Never Used  Substance and Sexual Activity   Alcohol use: Not Currently   Drug use: Never   Sexual activity: Yes    Birth control/protection: None  Other Topics Concern   Not on file  Social History Narrative   Not on file   Social Determinants of Health   Financial Resource Strain: Low Risk  (06/09/2022)   Overall Financial Resource Strain (CARDIA)    Difficulty of Paying Living Expenses: Not hard at all  Food Insecurity: No Food Insecurity (11/14/2022)   Hunger Vital Sign    Worried About Running Out of Food  in the Last Year: Never true    Ran Out of Food in the Last Year: Never true  Transportation Needs: No Transportation Needs (11/14/2022)   PRAPARE - Administrator, Civil Service (Medical): No    Lack of Transportation (Non-Medical): No  Physical Activity: Sufficiently Active (06/09/2022)   Exercise Vital Sign    Days of Exercise per Week: 5 days    Minutes of Exercise per Session: 30 min  Stress: No Stress Concern Present (06/09/2022)   [employer] of Occupational Health - Occupational Stress Questionnaire    Feeling of Stress : Not at all  Social Connections: Moderately Integrated (06/09/2022)   Social Connection and Isolation Panel [NHANES]    Frequency of Communication with Friends and Family: More than three times a week    Frequency of Social Gatherings with Friends and Family: More than three times a week    Attends Religious Services: 1 to 4 times per year    Active Member of [employer] or Organizations: No    Attends Engineer, structural: Not on file    Marital Status: Living with partner    Family History: Family History  Problem Relation Age of Onset   Healthy Mother    Healthy Father     Allergies: No Known Allergies  Medications Prior to Admission  Medication Sig Dispense Refill Last Dose   aspirin 81 MG chewable tablet Chew 1 tablet (81 mg total) by mouth daily. 30 tablet 6 11/14/2022   NIFEdipine (ADALAT CC) 60 MG 24 hr tablet TAKE 1 TABLET BY MOUTH EVERY DAY 30 tablet 4 11/14/2022   prenatal vitamin w/FE, FA (PRENATAL 1 + 1) 27-1 MG TABS tablet Take 1 tablet by mouth daily at 12 noon.   11/14/2022   Accu-Chek Softclix Lancets lancets Use as instructed. QID 100 each 12    Blood Glucose Monitoring Suppl (ACCU-CHEK GUIDE ME) w/Device KIT USE AS DIRECTED 4 TIMES A DAY      glucose blood test strip Use as instructed. QID 100 each 12      Review of Systems   All systems reviewed and negative except as stated in HPI  Blood pressure (!) 138/92,  pulse 88, resp. rate 18, height 5\' 7"  (1.702 m), weight (!) 151.5 kg, last menstrual period 02/11/2022. General appearance: alert, cooperative, and appears stated age Lungs: clear to auscultation bilaterally Heart: regular rate and rhythm Abdomen: soft, non-tender; bowel  sounds normal Pelvic: No lesions Extremities: Homans sign is negative, no sign of DVT  Presentation: cephalic Fetal monitoringBaseline: 150 bpm, Variability: Good {> 6 bpm), Accelerations: Reactive, and Decelerations: Absent Uterine activity irritability     Prenatal labs: ABO, Rh: --/--/PENDING (07/19 2339) Antibody: PENDING (07/19 2339) Rubella: 2.02 (12/07 1429) RPR: Non Reactive (05/20 1003)  HBsAg: Negative (12/07 1429)  HIV: Non Reactive (05/20 1003)  GBS: Negative/-- (07/10 1422)  1 hr Glucola failed Genetic screening low risk Anatomy US LGA  Prenatal Transfer Tool  Maternal Diabetes: Yes:  Diabetes Type:  Diet controlled Genetic Screening: Normal Maternal Ultrasounds/Referrals: Normal Fetal Ultrasounds or other Referrals:  Referred to Materal Fetal Medicine  Maternal Substance Abuse:  No Significant Maternal Medications:  None Significant Maternal Lab Results:  Group B Strep negative Number of Prenatal Visits:greater than 3 verified prenatal visits Other Comments:  None  Results for orders placed or performed during the hospital encounter of 11/14/22 (from the past 24 hour(s))  CBC   Collection Time: 11/14/22 11:39 PM  Result Value Ref Range   WBC 10.9 (H) 4.0 - 10.5 K/uL   RBC 4.91 3.87 - 5.11 MIL/uL   Hemoglobin 13.3 12.0 - 15.0 g/dL   HCT 65.7 84.6 - 96.2 %   MCV 83.7 80.0 - 100.0 fL   MCH 27.1 26.0 - 34.0 pg   MCHC 32.4 30.0 - 36.0 g/dL   RDW 95.2 84.1 - 32.4 %   Platelets 359 150 - 400 K/uL   nRBC 0.0 0.0 - 0.2 %  Type and screen   Collection Time: 11/14/22 11:39 PM  Result Value Ref Range   ABO/RH(D) PENDING    Antibody Screen PENDING    Sample Expiration       11/17/2022,2359 Performed at [facility], [address] [place], [place] [address]   Glucose, capillary   Collection Time: 11/14/22 11:56 PM  Result Value Ref Range   Glucose-Capillary 155 (H) 70 - 99 mg/dL    Patient Active Problem List   Diagnosis Date Noted   Indication for care in labor or delivery 11/14/2022   BMI 50.0-59.9, adult (HCC) 05/19/2022   Gestational diabetes mellitus, class A1 05/19/2022   Chronic hypertension during pregnancy (Procardia), antepartum 04/30/2022   Supervision of high risk pregnancy, antepartum 04/30/2022   Elevated hemoglobin A1c 04/04/2022    Assessment/Plan:  Kellianne Ek is a 27 y.o. G1P0 at [redacted]w[redacted]d here for IOL for CHTN, A1 GDM  #Labor: Cytotec 50 mcg p.o./vaginal #Pain: Per patient request #FWB: Category 1 #ID:  GBS negative #MOF: Breast #MOC: OCPs #Circ:  Yes # CHTN: On Procardia 60 mg daily.  Pree labs ordered.  CTM # A1 GDM: Prenatal A1c 5.7%, suspicion for type 2 diabetes.  Initial CBG 155.  Will continue CBG monitoring for now.  If next CBG monitoring is elevated, will plan to start Endo tool during labor.  EFW 96% at 34 weeks, extrapolated to approximately 3800 g birthweight.  Deborah Crocker Mercado-Ortiz, DO  11/15/2022, 12:26 AM

## 2022-11-16 ENCOUNTER — Encounter (HOSPITAL_COMMUNITY): Payer: Self-pay | Admitting: Family Medicine

## 2022-11-16 DIAGNOSIS — Z3A37 37 weeks gestation of pregnancy: Secondary | ICD-10-CM

## 2022-11-16 DIAGNOSIS — O3663X0 Maternal care for excessive fetal growth, third trimester, not applicable or unspecified: Secondary | ICD-10-CM

## 2022-11-16 DIAGNOSIS — O1002 Pre-existing essential hypertension complicating childbirth: Secondary | ICD-10-CM

## 2022-11-16 DIAGNOSIS — O24420 Gestational diabetes mellitus in childbirth, diet controlled: Secondary | ICD-10-CM

## 2022-11-16 DIAGNOSIS — O139 Gestational [pregnancy-induced] hypertension without significant proteinuria, unspecified trimester: Secondary | ICD-10-CM

## 2022-11-16 DIAGNOSIS — O24419 Gestational diabetes mellitus in pregnancy, unspecified control: Secondary | ICD-10-CM

## 2022-11-16 LAB — CBC
HCT: 38.5 % (ref 36.0–46.0)
Hemoglobin: 12.3 g/dL (ref 12.0–15.0)
MCH: 26.6 pg (ref 26.0–34.0)
MCHC: 31.9 g/dL (ref 30.0–36.0)
MCV: 83.2 fL (ref 80.0–100.0)
Platelets: 291 10*3/uL (ref 150–400)
RBC: 4.63 MIL/uL (ref 3.87–5.11)
RDW: 13.2 % (ref 11.5–15.5)
WBC: 21.1 10*3/uL — ABNORMAL HIGH (ref 4.0–10.5)
nRBC: 0 % (ref 0.0–0.2)

## 2022-11-16 LAB — GLUCOSE, CAPILLARY
Glucose-Capillary: 81 mg/dL (ref 70–99)
Glucose-Capillary: 93 mg/dL (ref 70–99)
Glucose-Capillary: 97 mg/dL (ref 70–99)
Glucose-Capillary: 92 mg/dL (ref 70–99)
Glucose-Capillary: 90 mg/dL (ref 70–99)
Glucose-Capillary: 101 mg/dL — ABNORMAL HIGH (ref 70–99)

## 2022-11-16 MED ORDER — FUROSEMIDE 20 MG PO TABS
20.0000 mg | ORAL_TABLET | Freq: Every day | ORAL | Status: DC
  Administered 2022-11-16 – 2022-11-18 (×3): 20 mg via ORAL
  Filled 2022-11-16 (×3): qty 1

## 2022-11-16 MED ORDER — SODIUM CHLORIDE 0.9% FLUSH: 3.0000 mL | Freq: Two times a day (BID) | INTRAVENOUS | Status: DC

## 2022-11-16 MED ORDER — FENTANYL CITRATE (PF) 100 MCG/2ML IJ SOLN
INTRAMUSCULAR | Status: DC | PRN
  Administered 2022-11-16 (×2): 100 ug via EPIDURAL

## 2022-11-16 MED ORDER — WITCH HAZEL-GLYCERIN EX PADS: 1.0000 | MEDICATED_PAD | CUTANEOUS | Status: DC | PRN

## 2022-11-16 MED ORDER — SODIUM CHLORIDE 0.9% FLUSH: 3.0000 mL | INTRAVENOUS | Status: DC | PRN

## 2022-11-16 MED ORDER — PRENATAL MULTIVITAMIN CH
1.0000 | ORAL_TABLET | Freq: Every day | ORAL | Status: DC
  Administered 2022-11-17 – 2022-11-18 (×2): 1 via ORAL
  Filled 2022-11-16 (×2): qty 1

## 2022-11-16 MED ORDER — SIMETHICONE 80 MG PO CHEW: 80.0000 mg | CHEWABLE_TABLET | ORAL | Status: DC | PRN

## 2022-11-16 MED ORDER — SODIUM CHLORIDE 0.9 % IV SOLN: INTRAVENOUS | Status: DC | PRN

## 2022-11-16 MED ORDER — DIPHENHYDRAMINE HCL 25 MG PO CAPS: 25.0000 mg | ORAL_CAPSULE | Freq: Four times a day (QID) | ORAL | Status: DC | PRN

## 2022-11-16 MED ORDER — ZOLPIDEM TARTRATE 5 MG PO TABS: 5.0000 mg | ORAL_TABLET | Freq: Every evening | ORAL | Status: DC | PRN

## 2022-11-16 MED ORDER — COCONUT OIL OIL: 1.0000 | TOPICAL_OIL | Status: DC | PRN

## 2022-11-16 MED ORDER — FENTANYL CITRATE (PF) 100 MCG/2ML IJ SOLN
INTRAMUSCULAR | Status: AC
  Filled 2022-11-16: qty 2

## 2022-11-16 MED ORDER — OXYCODONE HCL 5 MG PO TABS: 5.0000 mg | ORAL_TABLET | ORAL | Status: DC | PRN

## 2022-11-16 MED ORDER — DIBUCAINE (PERIANAL) 1 % EX OINT: 1.0000 | TOPICAL_OINTMENT | CUTANEOUS | Status: DC | PRN

## 2022-11-16 MED ORDER — TRANEXAMIC ACID-NACL 1000-0.7 MG/100ML-% IV SOLN
1000.0000 mg | INTRAVENOUS | Status: AC
  Administered 2022-11-16: 1000 mg via INTRAVENOUS

## 2022-11-16 MED ORDER — SENNOSIDES-DOCUSATE SODIUM 8.6-50 MG PO TABS
2.0000 | ORAL_TABLET | ORAL | Status: DC
  Administered 2022-11-17 – 2022-11-18 (×2): 2 via ORAL
  Filled 2022-11-16 (×2): qty 2

## 2022-11-16 MED ORDER — ONDANSETRON HCL 4 MG PO TABS: 4.0000 mg | ORAL_TABLET | ORAL | Status: DC | PRN

## 2022-11-16 MED ORDER — TRANEXAMIC ACID-NACL 1000-0.7 MG/100ML-% IV SOLN
INTRAVENOUS | Status: AC
  Filled 2022-11-16: qty 100

## 2022-11-16 MED ORDER — BUPIVACAINE HCL (PF) 0.25 % IJ SOLN
INTRAMUSCULAR | Status: DC | PRN
  Administered 2022-11-16 (×2): 8 mL via EPIDURAL

## 2022-11-16 MED ORDER — BENZOCAINE-MENTHOL 20-0.5 % EX AERO
1.0000 | INHALATION_SPRAY | CUTANEOUS | Status: DC | PRN
  Administered 2022-11-16: 1 via TOPICAL
  Filled 2022-11-16: qty 56

## 2022-11-16 MED ORDER — ONDANSETRON HCL 4 MG/2ML IJ SOLN: 4.0000 mg | INTRAMUSCULAR | Status: DC | PRN

## 2022-11-16 MED ORDER — ACETAMINOPHEN 325 MG PO TABS: 650.0000 mg | ORAL_TABLET | ORAL | Status: DC | PRN

## 2022-11-16 MED ORDER — IBUPROFEN 600 MG PO TABS
600.0000 mg | ORAL_TABLET | Freq: Four times a day (QID) | ORAL | Status: DC
  Administered 2022-11-16 – 2022-11-18 (×7): 600 mg via ORAL
  Filled 2022-11-16 (×7): qty 1

## 2022-11-16 NOTE — Anesthesia Postprocedure Evaluation (Signed)
Anesthesia Post Note  Patient: Avi Archuleta  Procedure(s) Performed: AN AD HOC LABOR EPIDURAL     Patient location during evaluation: Mother Baby Anesthesia Type: Epidural Level of consciousness: awake and alert Pain management: pain level controlled Vital Signs Assessment: post-procedure vital signs reviewed and stable Respiratory status: spontaneous breathing, nonlabored ventilation and respiratory function stable Cardiovascular status: stable Postop Assessment: no headache, no backache and epidural receding Anesthetic complications: no   No notable events documented.  Last Vitals:  Vitals:   11/16/22 1535 11/16/22 1650  BP: 137/82 135/80  Pulse: 80 73  Resp: 16 16  Temp: 36.8 C 36.6 C  SpO2: 99% 95%    Last Pain:  Vitals:   11/16/22 1650  TempSrc: Oral  PainSc:    Pain Goal:                   Eldrick Penick

## 2022-11-16 NOTE — Discharge Summary (Shared)
Postpartum Discharge Summary  Date of Service updated***     Patient Name: Deborah [name] DOB: 30-Jan-1996 MRN: [id number]  Date of admission: 11/14/2022 Delivery date:11/16/2022 Delivering provider: Alfredia Ferguson Date of discharge: 11/16/2022  Admitting diagnosis: Indication for care in labor or delivery [O75.9] Intrauterine pregnancy: [redacted]w[redacted]d     Secondary diagnosis:  Principal Problem:   Indication for care in labor or delivery Active Problems:   Elevated hemoglobin A1c   Chronic hypertension during pregnancy (Procardia), antepartum   Supervision of high risk pregnancy, antepartum  Additional problems: ***    Discharge diagnosis: Term Pregnancy Delivered, CHTN, and GDM A1                                              Post partum procedures:{Postpartum procedures:23558} Augmentation: AROM, Pitocin, and Cytotec Complications: None  Hospital course: Induction of Labor With Vaginal Delivery   27 y.o. yo G1P0 at [redacted]w[redacted]d was admitted to the hospital 11/14/2022 for induction of labor.  Indication for induction: A1 DM and cHTN .  Patient had an labor course complicated by: none Membrane Rupture Time/Date: 9:21 PM,11/15/2022  Delivery Method:Vaginal, Spontaneous Episiotomy: None Lacerations:  None Details of delivery can be found in separate delivery note.  Patient had a postpartum course complicated by***. Patient is discharged home 11/16/22.  Newborn Data: Birth date:11/16/2022 Birth time:1:01 PM Gender:Female Living status:Living Apgars:7 ,9  Weight:   Magnesium Sulfate received: {Mag received:30440022} BMZ received: No Rhophylac:N/A MMR:N/A T-DaP:Given prenatally Flu: N/A Transfusion:{Transfusion received:30440034}  Physical exam  Vitals:   11/16/22 0902 11/16/22 0932 11/16/22 1002 11/16/22 1032  BP: (!) 144/84 (!) 159/101 (!) 146/78 134/85  Pulse: 72 75 80 91  Resp:      Temp:      TempSrc:      SpO2:      Weight:      Height:       General: {Exam;  general:21111117} Lochia: {Desc; appropriate/inappropriate:30686::"appropriate"} Uterine Fundus: {Desc; firm/soft:30687} Incision: {Exam; incision:21111123} DVT Evaluation: {Exam; dvt:2111122} Labs: Lab Results  Component Value Date   WBC 13.5 (H) 11/15/2022   HGB 12.9 11/15/2022   HCT 40.5 11/15/2022   MCV 85.6 11/15/2022   PLT 301 11/15/2022      Latest Ref Rng & Units 04/30/2022   10:24 AM  CMP  Glucose 70 - 99 mg/dL 782   BUN 6 - 20 mg/dL 8   Creatinine 9.56 - 2.13 mg/dL 0.86   Sodium 578 - 469 mmol/L 138   Potassium 3.5 - 5.2 mmol/L 4.4   Chloride 96 - 106 mmol/L 102   CO2 20 - 29 mmol/L 20   Calcium 8.7 - 10.2 mg/dL 9.6   Total Protein 6.0 - 8.5 g/dL 6.6   Total Bilirubin 0.0 - 1.2 mg/dL <6.2   Alkaline Phos 44 - 121 IU/L 94   AST 0 - 40 IU/L 9   ALT 0 - 32 IU/L 17    Edinburgh Score:     No data to display           After visit meds:  Allergies as of 11/16/2022   No Known Allergies   Med Rec must be completed prior to using this [facility]***        Discharge home in stable condition Infant Feeding: {Baby feeding:23562} Infant Disposition:{CHL IP OB HOME WITH XBMWUX:32440} Discharge instruction: per After Visit  Summary and Postpartum booklet. Activity: Advance as tolerated. Pelvic rest for 6 weeks.  Diet: {OB ZOXW:96045409} Future Appointments:No future appointments. Follow up Visit:   Please schedule this patient for a In person postpartum visit in 6 weeks with the following provider: Any provider. Additional Postpartum F/U:2 hour GTT and BP check 1 week  High risk pregnancy complicated by:  cHTN, A1GDM Delivery mode:  Vaginal, Spontaneous Anticipated Birth Control:  Unsure. Possibly pills   11/16/2022 Alfredia Ferguson, MD

## 2022-11-16 NOTE — Progress Notes (Signed)
Labor Progress Note  [name] is a 27 y.o. G1P0 at [redacted]w[redacted]d presented for IOL cHTN, A1 GDM, LGA  S: Notified of difficulty tracing contractions with external tocometer. Patient feeling some more pressure.  O:  BP 126/60   Pulse 82   Temp 98 F (36.7 C) (Oral)   Resp 20   Ht 5\' 7"  (1.702 m)   Wt (!) 151.5 kg   LMP 02/11/2022   SpO2 100%   BMI 52.30 kg/m  EFM: 145 bpm/Moderate variability/ 15x15 accels/ Variable decels CAT: 1 Toco: regular, every 1-4 minutes   CVE: Dilation: 6.5 Effacement (%): 100 Cervical Position: Posterior Station: -2 Presentation: Vertex Exam by:: Dr. Anette Guarneri   A&P: 27 y.o. G1P0 [redacted]w[redacted]d  here for IOL as above  #Labor: Progressing well.  IUPC placed. #Pain: Family/Friend support and Epidural #FWB: CAT 1 #GBS negative # CHTN: On Procardia 60 mg daily.  Pree labs nml.  CTM # A1GDM: Last CBG 97.  CTM CBG every 2 hours #LGA: EFW 96% at 34 weeks, extrapolated to approximately 3800 g birthweight.  Myrtie Hawk, DO FMOB Fellow, Faculty practice [facility], Center for [facility] 11/16/22  7:16 AM

## 2022-11-16 NOTE — Lactation Note (Signed)
This note was copied from a baby's chart. Lactation Consultation Note  Patient Name: Deborah [name] [id number] Date: 11/16/2022 Age:25 hours Reason for consult: Initial assessment;1st time breastfeeding;Breastfeeding assistance;Early term 37-38.6wks;Maternal endocrine disorder P1, ETI female infant, with low blood glucose of 36 mg/ dl but infant has not latched at breast yet. Infant appeared sleepy, LC discussed how to hand express using breast model and Birth Parent easily self expressed 2 mls of colostrum that was given to infant by spoon, infant became alert and start cuing to feed after receiving colostrum by spoon. Birth Parent latched infant on her left breast using the football hold position, infant sustained latch and was still breastfeeding after 14 minutes when LC left the room. Birth Parent knows if infant is still cuing after latching on the 1st breast to offer the 2nd breast within the same feeding. Birth Parent will continue to breastfeed infant by cues, on demand, 8 to 12+ times within 24 hours, skin to skin. Birth Parent will hand express and give infant back EBM by spoon if infant does not latch and  knows to call RN/LC for latch assistance if needed. LC discussed infant's input and output on 1st day of life. LC discussed the importance of maternal rest, diet and hydration. Birth Parent was made aware of O/P services, breastfeeding support groups, community resources, and our phone # for post-discharge questions.     Maternal Data Has patient been taught Hand Expression?: Yes Does the patient have breastfeeding experience prior to this delivery?: No  Feeding Mother's Current Feeding Choice: Breast Milk  LATCH Score Latch: Grasps breast easily, tongue down, lips flanged, rhythmical sucking.  Audible Swallowing: A few with stimulation  Type of Nipple: Everted at rest and after stimulation  Comfort (Breast/Nipple): Soft / non-tender  Hold (Positioning): Assistance needed to  correctly position infant at breast and maintain latch.  LATCH Score: 8   Lactation Tools Discussed/Used    Interventions Interventions: Education;Position options;Skin to skin;Assisted with latch;Support pillows;Breast feeding basics reviewed;Adjust position;Breast massage;Breast compression;LC Services brochure;Expressed milk;Hand express  Discharge Pump: DEBP;Personal  Consult Status Consult Status: Follow-up Date: 11/17/22 Follow-up type: In-patient    Frederico Hamman 11/16/2022, 4:45 PM

## 2022-11-16 NOTE — Progress Notes (Signed)
Labor Progress Note  Deborah [name] is a 27 y.o. G1P0 at [redacted]w[redacted]d presented for IOL cHTN, A1 GDM, LGA  S: doing well. Siting up in "throne" position.   O:  BP (!) 159/101   Pulse 75   Temp 98 F (36.7 C) (Oral)   Resp 20   Ht 5\' 7"  (1.702 m)   Wt (!) 151.5 kg   LMP 02/11/2022   SpO2 100%   BMI 52.30 kg/m   EFM: 140 bpm, moderate variability, + accelerations, no decelerations CAT: 1 Toco: regular, every 1-3 minutes   CVE: Dilation: Lip/rim Effacement (%): 100 Cervical Position: Posterior Station: Plus 1 Presentation: Vertex Exam by:: Alethia Berthold RN   A&P: 27 y.o. G1P0 [redacted]w[redacted]d  here for IOL as above  #Labor: Progressing well. Almost fully dilated. Will plan to allow positioning for gravity to aid descent. Recheck cervix in 1-2 hrs and plan to start pushing when completely dilated. Anticipate VD soon. #Pain: Family/Friend support and Epidural #FWB: CAT 1 #GBS negative # CHTN: On Procardia 60 mg daily.  Pree labs nml.  CTM # A1GDM: Last CBG 97.  CTM CBG every 2 hours #LGA: EFW 96% at 34 weeks, extrapolated to approximately 3800 g birthweight.  Greig Altergott Lizabeth Leyden, MD FMOB Fellow, Faculty practice [facility], Center for [facility] 11/16/22  10:39 AM

## 2022-11-16 NOTE — Lactation Note (Signed)
This note was copied from a baby's chart. Lactation Consultation Note  Patient Name: Deborah [name] [id number] Date: 11/16/2022 Age:25 hours Reason for consult: Follow-up assessment;Mother's request;Difficult latch;1st time breastfeeding;Primapara;Maternal endocrine disorder.  Birth Parent requested latch assistance due infant not latching and sleepy at breast almost 4 hours since last feeding, Birth Parent has hand express previously due infant not latching at the breast. LC suggested hand expression again prior to latch. Birth Parent and Support Person hand expressed  2 mls of colostrum by spoon and LC did suck training infant became more alert and started cuing to breastfeed. Birth Parent latched infant on her right breast using the football hold position, infant was actively feeding, LC encouraged Birth Parent to do breast compression and talk to infant while feeding, infant was still breastfeeding after 11 minutes when LC left the room. Birth Parent will continue to work on latching infant at breast, asking for further latch assistance if need. Continue to BF infant by cues, on demand, 8 to 12+ times within 24 hours, skin to skin.  Maternal Data    Feeding Mother's Current Feeding Choice: Breast Milk  LATCH Score Latch: Grasps breast easily, tongue down, lips flanged, rhythmical sucking.  Audible Swallowing: Spontaneous and intermittent  Type of Nipple: Everted at rest and after stimulation  Comfort (Breast/Nipple): Soft / non-tender  Hold (Positioning): Assistance needed to correctly position infant at breast and maintain latch.  LATCH Score: 9   Lactation Tools Discussed/Used    Interventions Interventions: Adjust position;Support pillows;Assisted with latch;Skin to skin;Position options;Hand express;Breast compression;Breast massage;Education  Discharge    Consult Status Consult Status: Follow-up Date: 11/17/22 Follow-up type: In-patient    Deborah [name] 11/16/2022, 9:50 PM

## 2022-11-17 ENCOUNTER — Encounter (HOSPITAL_COMMUNITY): Payer: Self-pay | Admitting: Family Medicine

## 2022-11-17 LAB — GLUCOSE, CAPILLARY: Glucose-Capillary: 118 mg/dL — ABNORMAL HIGH (ref 70–99)

## 2022-11-17 NOTE — Progress Notes (Signed)
POSTPARTUM PROGRESS NOTE  Post Partum Day 1  Subjective:  Deborah [name] is a 27 y.o. G1P1001 s/p IOL -> VD at [redacted]w[redacted]d due to cHTN, LGA, a1DGM.  She reports she is doing well. No acute events overnight. She denies any problems with ambulating, voiding or po intake. Denies nausea or vomiting. Pain is well controlled.  Lochia is slightly lighter than her typical menstrual period. She has passed gas as well.  Objective: Blood pressure 113/80, pulse 72, temperature 98.8 F (37.1 C), temperature source Oral, resp. rate 18, height 5\' 7"  (1.702 m), weight (!) 151.5 kg, last menstrual period 02/11/2022, SpO2 98%, unknown if currently breastfeeding.  Physical Exam:  General: alert, cooperative and no distress Chest: no respiratory distress Heart:regular rate, distal pulses intact Uterine Fundus: firm, appropriately tender DVT Evaluation: No calf swelling or tenderness Extremities: No edema Skin: warm, dry  Recent Labs    11/15/22 1252 11/16/22 1515  HGB 12.9 12.3  HCT 40.5 38.5    Assessment/Plan: Deborah [name] is a 27 y.o. G1P1001 s/p vaginal delivery following IOL due to cHTN, LGA, A1GDM at [redacted]w[redacted]d. Patient had 3-0 figure 8 sutures placed to repair vaginal abrasion and achieve hemostasis. EBL 193 mL. Still has some lingering numbness in the right leg diffusely, alleviating significantly with time; this is likely from epidural and not compressive injury as it is not distributional. She has questions for lactation consultant regarding diet.  PPD#1 - Doing well  Routine postpartum care cHTN- Blood pressures have been controlled since her highest reading of 129/85 at 1 am. Continue lasix and Procardia 60 mg once daily. A1gDM- Fasting glucose 118 at 0600. Patient ate before this. Follow up GTT at 6-12 weeks. Contraception: POP's Feeding: Breastfeeding Dispo: Plan for discharge tomorrow.   LOS: 3 days   Kayleen Memos, Medical Student 11/17/2022, 7:03 AM

## 2022-11-17 NOTE — Lactation Note (Signed)
This note was copied from a baby's chart. Lactation Consultation Note  Patient Name: Deborah [name] [id number] Date: 11/17/2022 Age:25 hours Reason for consult: 1st time breastfeeding;Follow-up assessment;Early term 37-38.6wks.  Per Birth Parent, infant has been latching well at the breast, most feedings are 20 to 28 minutes in length today. No concerns for LC at this time. Infant had 3 stools and 4 voids since 1 am this morning. Infant had a weight gain since birth. Birth Parent is BF infant by cues on demand, 8 to 12+ times, infant is currently cluster feeding. Birth Parent knows to call [facility] services if she has any BF questions, concerns or need latch assistance.   Maternal Data    Feeding Mother's Current Feeding Choice: Breast Milk  LATCH Score                    Lactation Tools Discussed/Used    Interventions Interventions: Skin to skin;Position options;Education;Hand express  Discharge    Consult Status Consult Status: Follow-up Date: 11/18/22 Follow-up type: In-patient    Frederico Hamman 11/17/2022, 7:46 PM

## 2022-11-18 ENCOUNTER — Other Ambulatory Visit (HOSPITAL_COMMUNITY): Payer: Self-pay

## 2022-11-18 MED ORDER — SENNOSIDES-DOCUSATE SODIUM 8.6-50 MG PO TABS
2.0000 | ORAL_TABLET | ORAL | 0 refills | Status: DC
  Filled 2022-11-18: qty 30, 15d supply, fill #0

## 2022-11-18 MED ORDER — IBUPROFEN 600 MG PO TABS
600.0000 mg | ORAL_TABLET | Freq: Four times a day (QID) | ORAL | 0 refills | Status: DC
Start: 2022-11-18 — End: 2023-11-11
  Filled 2022-11-18: qty 30, 8d supply, fill #0

## 2022-11-18 MED ORDER — FUROSEMIDE 20 MG PO TABS
20.0000 mg | ORAL_TABLET | Freq: Every day | ORAL | 0 refills | Status: DC
Start: 2022-11-18 — End: 2023-11-11
  Filled 2022-11-18: qty 5, 5d supply, fill #0

## 2022-11-18 MED ORDER — ACETAMINOPHEN 325 MG PO TABS
650.0000 mg | ORAL_TABLET | ORAL | 0 refills | Status: DC | PRN
  Filled 2022-11-18: qty 100, 9d supply, fill #0

## 2022-11-18 NOTE — Lactation Note (Signed)
This note was copied from a baby's chart. Lactation Consultation Note  Patient Name: Boy Arianne Klinge FAOZH'Y Date: 11/18/2022 Age:25 hours  Reason for consult: Follow-up assessment;Primapara;1st time breastfeeding;Early term 37-38.6wks  P1 [redacted]w[redacted]d, 1% weight loss (gain from 4% weight loss yesterday)  Mother had baby latched to breast upon arrival to room. Infant had a deep latch and mother was doing breast compression to keep baby engaged in the feeding. Increase swallowing noted with breast compression. Mother reports she can easily express colostrum and the volume is increasing.   Parents state that baby cluster fed last night. Denies any questions or concerns.     Maternal Data Has patient been taught Hand Expression?: Yes  Feeding Mother's Current Feeding Choice: Breast Milk  LATCH Score Latch: Grasps breast easily, tongue down, lips flanged, rhythmical sucking.  Audible Swallowing: A few with stimulation  Type of Nipple: Everted at rest and after stimulation  Comfort (Breast/Nipple): Soft / non-tender  Hold (Positioning): No assistance needed to correctly position infant at breast.  LATCH Score: 9   Interventions Interventions: Education  Discharge Discharge Education: Engorgement and breast care;Warning signs for feeding baby;Other (comment) (mother reports she has LC OP support infomation handout) Pump: DEBP;Hands Free  Consult Status Consult Status: Complete Date: 11/18/22    Omar Person 11/18/2022, 10:54 AM

## 2022-12-05 ENCOUNTER — Inpatient Hospital Stay (HOSPITAL_COMMUNITY): Admission: AD | Admit: 2022-12-05 | Payer: BC Managed Care – PPO | Source: Home / Self Care

## 2022-12-15 ENCOUNTER — Telehealth (HOSPITAL_COMMUNITY): Payer: Self-pay | Admitting: *Deleted

## 2022-12-15 NOTE — Telephone Encounter (Signed)
12/15/2022  Name: Deborah [name] MRN: [id number] DOB: 1996/02/27  Reason for Call:  Transition of Care Hospital Discharge Call  Contact Status: Patient Contact Status: Message  Language assistant needed:          Follow-Up Questions:    Inocente Salles Postnatal Depression Scale:  In the Past 7 Days:    PHQ2-9 Depression Scale:     Discharge Follow-up:    Post-discharge interventions: Reviewed Newborn Safe Sleep Practices  Salena Saner, RN 12/15/2022 11:20

## 2022-12-25 ENCOUNTER — Encounter: Payer: Self-pay | Admitting: Family Medicine

## 2022-12-25 ENCOUNTER — Other Ambulatory Visit: Payer: Self-pay

## 2022-12-25 ENCOUNTER — Ambulatory Visit (INDEPENDENT_AMBULATORY_CARE_PROVIDER_SITE_OTHER): Payer: BC Managed Care – PPO | Admitting: Obstetrics and Gynecology

## 2022-12-25 ENCOUNTER — Encounter: Payer: Self-pay | Admitting: Obstetrics and Gynecology

## 2022-12-25 DIAGNOSIS — O24410 Gestational diabetes mellitus in pregnancy, diet controlled: Secondary | ICD-10-CM

## 2022-12-25 DIAGNOSIS — O10919 Unspecified pre-existing hypertension complicating pregnancy, unspecified trimester: Secondary | ICD-10-CM

## 2022-12-25 MED ORDER — NORGESTIM-ETH ESTRAD TRIPHASIC 0.18/0.215/0.25 MG-25 MCG PO TABS: 1.0000 | ORAL_TABLET | Freq: Every day | ORAL | 11 refills | Status: DC

## 2022-12-25 NOTE — Progress Notes (Signed)
Pt wants Birth Control Pills 

## 2022-12-25 NOTE — Progress Notes (Signed)
Post Partum Visit Note  Deborah [name] is a 27 y.o. G43P1001 female who presents for a postpartum visit. She is 5 weeks postpartum following a normal spontaneous vaginal delivery.  I have fully reviewed the prenatal and intrapartum course. The delivery was at 37/2 gestational weeks.  Anesthesia: epidural. Postpartum course has been complicated by an episode of mastitis relieved with antibiotics and warm compresses. Baby is doing well. Baby is feeding by both breast and bottle - Carnation Good Start. Bleeding moderate lochia. Bowel function is normal. Bladder function is normal. Patient is not sexually active. Contraception method is none. Postpartum depression screening: negative.   The pregnancy intention screening data noted above was reviewed. Potential methods of contraception were discussed. The patient elected to proceed with No data recorded.    Health Maintenance Due  Topic Date Due   HPV VACCINES (1 - 3-dose series) Never done   COVID-19 Vaccine (1 - 2023-24 season) Never done   INFLUENZA VACCINE  11/27/2022    The following portions of the patient's history were reviewed and updated as appropriate: allergies, current medications, past family history, past medical history, past social history, past surgical history, and problem list.  Review of Systems Pertinent items are noted in HPI.  Objective:  LMP 02/11/2022    General:  alert, cooperative, and no distress   Breasts:  normal  Lungs: clear to auscultation bilaterally  Heart:  regular rate and rhythm, S1, S2 normal, no murmur, click, rub or gallop  Abdomen: soft, non-tender; bowel sounds normal; no masses,  no organomegaly   Wound N/a  GU exam:  not indicated       Assessment:   1. Vaginal delivery 2. Chronic hypertension during pregnancy (Procardia), antepartum - continue procardia 60mg  daily, normotensive today  3. Gestational diabetes mellitus, class A1 - repeat 2hr OGTT in the next before 12 weeks  post partum  Normal postpartum exam.   Plan:   Essential components of care per ACOG recommendations:  1.  Mood and well being: Patient with positive depression screening today. Reviewed local resources for support.  - Patient tobacco use? No.   - hx of drug use? No.    2. Infant care and feeding:  - Patient currently breastmilk feeding? Yes. Discussed returning to work and pumping.  - Social determinants of health (SDOH) reviewed in EPIC. No concerns. The following needs were identified none.  3. Sexuality, contraception and birth spacing - Patient does not want a pregnancy in the next year.  Desired family size is 1 children.  - Reviewed reproductive life planning. Reviewed contraceptive methods based on pt preferences and effectiveness.  Patient desired Oral Contraceptive today.   - Discussed birth spacing of 18 months  4. Sleep and fatigue -Encouraged family/partner/community support of 4 hrs of uninterrupted sleep to help with mood and fatigue  5. Physical Recovery  - Discussed patients delivery and complications. She describes her labor as mixed. Longer than anticipated, but did not need a CS which she considers a win. - Patient had a Vaginal, no problems at delivery. Patient had a  no  laceration. Perineal healing reviewed. Patient expressed understanding - Patient has urinary incontinence? No. - Patient is not safe to resume physical and sexual activity  6.  Health Maintenance - HM due items addressed yes healthy weight - Last pap smear  Diagnosis  Date Value Ref Range Status  05/19/2022   Final   - Negative for intraepithelial lesion or malignancy (NILM)   Pap  smear not done at today's visit.  -Breast Cancer screening indicated? No.   7. Chronic Disease/Pregnancy Condition follow up: Hypertension  - PCP follow up  Wyn Forster, MD FMOB Fellow, Faculty practice [facility]

## 2022-12-26 ENCOUNTER — Encounter: Payer: Self-pay | Admitting: Obstetrics and Gynecology

## 2023-01-08 ENCOUNTER — Other Ambulatory Visit: Payer: BC Managed Care – PPO

## 2023-01-13 ENCOUNTER — Other Ambulatory Visit: Payer: Self-pay

## 2023-01-13 ENCOUNTER — Other Ambulatory Visit: Payer: BC Managed Care – PPO

## 2023-01-13 DIAGNOSIS — O24410 Gestational diabetes mellitus in pregnancy, diet controlled: Secondary | ICD-10-CM

## 2023-05-10 ENCOUNTER — Encounter (HOSPITAL_BASED_OUTPATIENT_CLINIC_OR_DEPARTMENT_OTHER): Payer: Self-pay | Admitting: Emergency Medicine

## 2023-05-10 ENCOUNTER — Other Ambulatory Visit: Payer: Self-pay

## 2023-05-10 DIAGNOSIS — R1012 Left upper quadrant pain: Secondary | ICD-10-CM | POA: Insufficient documentation

## 2023-05-10 LAB — COMPREHENSIVE METABOLIC PANEL
ALT: 15 U/L (ref 0–44)
AST: 13 U/L — ABNORMAL LOW (ref 15–41)
Albumin: 3.8 g/dL (ref 3.5–5.0)
Alkaline Phosphatase: 80 U/L (ref 38–126)
Anion gap: 9 (ref 5–15)
BUN: 17 mg/dL (ref 6–20)
CO2: 24 mmol/L (ref 22–32)
Calcium: 8.9 mg/dL (ref 8.9–10.3)
Chloride: 106 mmol/L (ref 98–111)
Creatinine, Ser: 0.9 mg/dL (ref 0.44–1.00)
GFR, Estimated: >60 mL/min (ref >60)
Glucose, Bld: 116 mg/dL — ABNORMAL HIGH (ref 70–99)
Potassium: 3.5 mmol/L (ref 3.5–5.1)
Sodium: 139 mmol/L (ref 135–145)
Total Bilirubin: 0.2 mg/dL (ref 0.0–1.2)
Total Protein: 7.4 g/dL (ref 6.5–8.1)

## 2023-05-10 LAB — CBC WITH DIFFERENTIAL/PLATELET
Abs Immature Granulocytes: 0.04 10*3/uL (ref 0.00–0.07)
Basophils Absolute: 0 10*3/uL (ref 0.0–0.1)
Basophils Relative: 0 %
Eosinophils Absolute: 0.1 10*3/uL (ref 0.0–0.5)
Eosinophils Relative: 1 %
HCT: 36.8 % (ref 36.0–46.0)
Hemoglobin: 11.9 g/dL — ABNORMAL LOW (ref 12.0–15.0)
Immature Granulocytes: 0 %
Lymphocytes Relative: 26 %
Lymphs Abs: 3.1 10*3/uL (ref 0.7–4.0)
MCH: 26.3 pg (ref 26.0–34.0)
MCHC: 32.3 g/dL (ref 30.0–36.0)
MCV: 81.4 fL (ref 80.0–100.0)
Monocytes Absolute: 0.7 10*3/uL (ref 0.1–1.0)
Monocytes Relative: 6 %
Neutro Abs: 7.8 10*3/uL — ABNORMAL HIGH (ref 1.7–7.7)
Neutrophils Relative %: 67 %
Platelets: 357 10*3/uL (ref 150–400)
RBC: 4.52 MIL/uL (ref 3.87–5.11)
RDW: 13.6 % (ref 11.5–15.5)
WBC: 11.7 10*3/uL — ABNORMAL HIGH (ref 4.0–10.5)
nRBC: 0 % (ref 0.0–0.2)

## 2023-05-10 LAB — URINALYSIS, ROUTINE W REFLEX MICROSCOPIC
Bilirubin Urine: NEGATIVE
Glucose, UA: NEGATIVE mg/dL
Hgb urine dipstick: NEGATIVE
Ketones, ur: NEGATIVE mg/dL
Leukocytes,Ua: NEGATIVE
Nitrite: NEGATIVE
Protein, ur: NEGATIVE mg/dL
Specific Gravity, Urine: 1.025 (ref 1.005–1.030)
pH: 5.5 (ref 5.0–8.0)

## 2023-05-10 LAB — LIPASE, BLOOD: Lipase: 32 U/L (ref 11–51)

## 2023-05-10 LAB — PREGNANCY, URINE: Preg Test, Ur: NEGATIVE

## 2023-05-10 NOTE — ED Triage Notes (Signed)
 Pt c/o LUQ pain w/ radiation to back after eating dinner tonight

## 2023-05-11 ENCOUNTER — Emergency Department (HOSPITAL_BASED_OUTPATIENT_CLINIC_OR_DEPARTMENT_OTHER): Payer: BC Managed Care – PPO

## 2023-05-11 ENCOUNTER — Emergency Department (HOSPITAL_BASED_OUTPATIENT_CLINIC_OR_DEPARTMENT_OTHER)
Admission: EM | Admit: 2023-05-11 | Discharge: 2023-05-11 | Disposition: A | Discharge status: Home / Self Care | Payer: BC Managed Care – PPO | Attending: Emergency Medicine | Admitting: Emergency Medicine

## 2023-05-11 DIAGNOSIS — R1012 Left upper quadrant pain: Secondary | ICD-10-CM

## 2023-05-11 MED ORDER — OMEPRAZOLE 20 MG PO CPDR: 20.0000 mg | DELAYED_RELEASE_CAPSULE | Freq: Every day | ORAL | 0 refills | Status: AC

## 2023-05-11 MED ORDER — KETOROLAC TROMETHAMINE 60 MG/2ML IM SOLN
30.0000 mg | Freq: Once | INTRAMUSCULAR | Status: AC
  Administered 2023-05-11: 30 mg via INTRAMUSCULAR
  Filled 2023-05-11: qty 2

## 2023-05-11 MED ORDER — ALUM & MAG HYDROXIDE-SIMETH 200-200-20 MG/5ML PO SUSP
30.0000 mL | Freq: Once | ORAL | Status: AC
  Administered 2023-05-11: 30 mL via ORAL
  Filled 2023-05-11: qty 30

## 2023-05-11 MED ORDER — LIDOCAINE VISCOUS HCL 2 % MT SOLN
15.0000 mL | Freq: Once | OROMUCOSAL | Status: AC
  Administered 2023-05-11: 15 mL via OROMUCOSAL
  Filled 2023-05-11: qty 15

## 2023-05-11 NOTE — ED Notes (Signed)
 Patient transported to CT

## 2023-05-11 NOTE — ED Provider Notes (Signed)
 [place] [facility] Provider Note   CSN: [id number] Arrival date & time: 05/10/23  2227     History  Chief Complaint  Patient presents with   Abdominal Pain    Deborah [name] is a 28 y.o. female.  The history is provided by the patient.  Abdominal Pain Pain location:  LUQ Pain quality: sharp   Pain radiates to:  L flank Pain severity:  Moderate Onset quality:  Sudden Duration:  4 hours Timing:  Constant Progression:  Unchanged Chronicity:  New Context: eating   Context comment:  Ate pasta sauce on pasta and garlic bread Relieved by:  Nothing Worsened by:  Nothing Ineffective treatments:  None tried Associated symptoms: no chest pain, no fever and no shortness of breath        Home Medications Prior to Admission medications   Medication Sig Start Date End Date Taking? Authorizing Provider  omeprazole  (PRILOSEC) 20 MG capsule Take 1 capsule (20 mg total) by mouth daily. 05/11/23  Yes Monteen Toops, MD  acetaminophen  (TYLENOL ) 325 MG tablet Take 2 tablets (650 mg total) by mouth every 4 (four) hours as needed (for pain scale < 4). 11/18/22   Everhart, Kirstie, DO  furosemide  (LASIX ) 20 MG tablet Take 1 tablet (20 mg total) by mouth daily. Patient not taking: Reported on 12/25/2022 11/18/22   Everhart, Kirstie, DO  ibuprofen  (ADVIL ) 600 MG tablet Take 1 tablet (600 mg total) by mouth every 6 (six) hours. 11/18/22   Everhart, Kirstie, DO  NIFEdipine  (ADALAT  CC) 60 MG 24 hr tablet TAKE 1 TABLET BY MOUTH EVERY DAY 11/03/22   Zina Jerilynn LABOR, MD  Norgestimate-Ethinyl Estradiol Triphasic (ORTHO TRI-CYCLEN LO) 0.18/0.215/0.25 MG-25 MCG tab Take 1 tablet by mouth daily. 12/25/22 12/25/23  Leveque, Alyssa, MD  prenatal vitamin w/FE, FA (PRENATAL 1 + 1) 27-1 MG TABS tablet Take 1 tablet by mouth daily at 12 noon.    [provider]  senna-docusate (SENOKOT-S) 8.6-50 MG tablet Take 2 tablets by mouth daily. Patient not taking:  Reported on 12/25/2022 11/18/22   Everhart, Kirstie, DO      Allergies    Patient has no known allergies.    Review of Systems   Review of Systems  Constitutional:  Negative for fever.  HENT:  Negative for facial swelling.   Respiratory:  Negative for shortness of breath, wheezing and stridor.   Cardiovascular:  Negative for chest pain.  Gastrointestinal:  Positive for abdominal pain.  All other systems reviewed and are negative.   Physical Exam Updated Vital Signs BP (!) 146/97 (BP Location: Right Arm)   Pulse 91   Temp 97.7 F (36.5 C) (Oral)   Resp 20   Ht 5' 7 (1.702 m)   Wt (!) 147.4 kg   LMP 04/21/2023   SpO2 97%   Breastfeeding No   BMI 50.90 kg/m  Physical Exam Constitutional:      General: She is not in acute distress.    Appearance: She is well-developed.  HENT:     Head: Normocephalic and atraumatic.     Nose: Nose normal.     Mouth/Throat:     Mouth: Mucous membranes are moist.  Eyes:     Pupils: Pupils are equal, round, and reactive to light.  Cardiovascular:     Rate and Rhythm: Normal rate and regular rhythm.     Pulses: Normal pulses.     Heart sounds: Normal heart sounds.  Pulmonary:     Effort:  Pulmonary effort is normal. No respiratory distress.     Breath sounds: Normal breath sounds.  Abdominal:     General: Bowel sounds are normal. There is no distension.     Palpations: Abdomen is soft. There is no mass.     Tenderness: There is no abdominal tenderness. There is no guarding or rebound. Negative signs include Murphy's sign.     Hernia: No hernia is present.  Musculoskeletal:        General: Normal range of motion.     Cervical back: Neck supple.  Skin:    General: Skin is warm and dry.     Capillary Refill: Capillary refill takes less than 2 seconds.     Findings: No erythema or rash.  Neurological:     General: No focal deficit present.     Mental Status: She is oriented to person, place, and time.     Deep Tendon Reflexes: Reflexes  normal.  Psychiatric:        Mood and Affect: Mood normal.     ED Results / Procedures / Treatments   Labs (all labs ordered are listed, but only abnormal results are displayed) Results for orders placed or performed during the hospital encounter of 05/11/23  Urinalysis, Routine w reflex microscopic -Urine, Clean Catch   Collection Time: 05/10/23 10:38 PM  Result Value Ref Range   Color, Urine YELLOW YELLOW   APPearance CLEAR CLEAR   Specific Gravity, Urine 1.025 1.005 - 1.030   pH 5.5 5.0 - 8.0   Glucose, UA NEGATIVE NEGATIVE mg/dL   Hgb urine dipstick NEGATIVE NEGATIVE   Bilirubin Urine NEGATIVE NEGATIVE   Ketones, ur NEGATIVE NEGATIVE mg/dL   Protein, ur NEGATIVE NEGATIVE mg/dL   Nitrite NEGATIVE NEGATIVE   Leukocytes,Ua NEGATIVE NEGATIVE  Pregnancy, urine   Collection Time: 05/10/23 10:38 PM  Result Value Ref Range   Preg Test, Ur NEGATIVE NEGATIVE  Lipase, blood   Collection Time: 05/10/23 10:39 PM  Result Value Ref Range   Lipase 32 11 - 51 U/L  Comprehensive metabolic panel   Collection Time: 05/10/23 10:39 PM  Result Value Ref Range   Sodium 139 135 - 145 mmol/L   Potassium 3.5 3.5 - 5.1 mmol/L   Chloride 106 98 - 111 mmol/L   CO2 24 22 - 32 mmol/L   Glucose, Bld 116 (H) 70 - 99 mg/dL   BUN 17 6 - 20 mg/dL   Creatinine, Ser 9.09 0.44 - 1.00 mg/dL   Calcium 8.9 8.9 - 89.6 mg/dL   Total Protein 7.4 6.5 - 8.1 g/dL   Albumin 3.8 3.5 - 5.0 g/dL   AST 13 (L) 15 - 41 U/L   ALT 15 0 - 44 U/L   Alkaline Phosphatase 80 38 - 126 U/L   Total Bilirubin 0.2 0.0 - 1.2 mg/dL   GFR, Estimated >39 >39 mL/min   Anion gap 9 5 - 15  CBC with Differential   Collection Time: 05/10/23 10:39 PM  Result Value Ref Range   WBC 11.7 (H) 4.0 - 10.5 K/uL   RBC 4.52 3.87 - 5.11 MIL/uL   Hemoglobin 11.9 (L) 12.0 - 15.0 g/dL   HCT 63.1 63.9 - 53.9 %   MCV 81.4 80.0 - 100.0 fL   MCH 26.3 26.0 - 34.0 pg   MCHC 32.3 30.0 - 36.0 g/dL   RDW 86.3 88.4 - 84.4 %   Platelets 357 150 - 400  K/uL   nRBC 0.0 0.0 - 0.2 %  Neutrophils Relative % 67 %   Neutro Abs 7.8 (H) 1.7 - 7.7 K/uL   Lymphocytes Relative 26 %   Lymphs Abs 3.1 0.7 - 4.0 K/uL   Monocytes Relative 6 %   Monocytes Absolute 0.7 0.1 - 1.0 K/uL   Eosinophils Relative 1 %   Eosinophils Absolute 0.1 0.0 - 0.5 K/uL   Basophils Relative 0 %   Basophils Absolute 0.0 0.0 - 0.1 K/uL   Immature Granulocytes 0 %   Abs Immature Granulocytes 0.04 0.00 - 0.07 K/uL   CT Renal Stone Study Result Date: 05/11/2023 CLINICAL DATA:  Left upper quadrant pain radiating to back EXAM: CT ABDOMEN AND PELVIS WITHOUT CONTRAST TECHNIQUE: Multidetector CT imaging of the abdomen and pelvis was performed following the standard protocol without IV contrast. RADIATION DOSE REDUCTION: This exam was performed according to the departmental dose-optimization program which includes automated exposure control, adjustment of the mA and/or kV according to patient size and/or use of iterative reconstruction technique. COMPARISON:  None Available. FINDINGS: Lower chest: Lung bases are clear. Hepatobiliary: Unenhanced liver is unremarkable. Gallbladder is unremarkable. No intrahepatic or extrahepatic ductal dilatation. Pancreas: Within normal limits. Spleen: Within normal limits. Adrenals/Urinary Tract: Adrenal glands are within normal limits. Kidneys are within normal limits. No renal, ureteral, or bladder calculi. No hydronephrosis. Bladder is within normal limits. Stomach/Bowel: Stomach is within normal limits. No evidence of bowel obstruction. Normal appendix (series 301/image 59). No colonic wall thickening or inflammatory changes. Vascular/Lymphatic: No evidence of abdominal aortic aneurysm. No suspicious abdominopelvic lymphadenopathy. Reproductive: Uterus is within normal limits. Left ovary is within normal limits. 4.7 cm right ovarian cyst/follicle, benign/physiologic. No follow-up is recommended. Other: No abdominopelvic ascites. Musculoskeletal: Visualized  osseous structures are within normal limits. IMPRESSION: Negative CT abdomen/pelvis. No CT findings to account for the patient's left upper quadrant abdominal pain. Electronically Signed   By: Pinkie Pebbles M.D.   On: 05/11/2023 01:31    Radiology CT Renal Stone Study Result Date: 05/11/2023 CLINICAL DATA:  Left upper quadrant pain radiating to back EXAM: CT ABDOMEN AND PELVIS WITHOUT CONTRAST TECHNIQUE: Multidetector CT imaging of the abdomen and pelvis was performed following the standard protocol without IV contrast. RADIATION DOSE REDUCTION: This exam was performed according to the departmental dose-optimization program which includes automated exposure control, adjustment of the mA and/or kV according to patient size and/or use of iterative reconstruction technique. COMPARISON:  None Available. FINDINGS: Lower chest: Lung bases are clear. Hepatobiliary: Unenhanced liver is unremarkable. Gallbladder is unremarkable. No intrahepatic or extrahepatic ductal dilatation. Pancreas: Within normal limits. Spleen: Within normal limits. Adrenals/Urinary Tract: Adrenal glands are within normal limits. Kidneys are within normal limits. No renal, ureteral, or bladder calculi. No hydronephrosis. Bladder is within normal limits. Stomach/Bowel: Stomach is within normal limits. No evidence of bowel obstruction. Normal appendix (series 301/image 59). No colonic wall thickening or inflammatory changes. Vascular/Lymphatic: No evidence of abdominal aortic aneurysm. No suspicious abdominopelvic lymphadenopathy. Reproductive: Uterus is within normal limits. Left ovary is within normal limits. 4.7 cm right ovarian cyst/follicle, benign/physiologic. No follow-up is recommended. Other: No abdominopelvic ascites. Musculoskeletal: Visualized osseous structures are within normal limits. IMPRESSION: Negative CT abdomen/pelvis. No CT findings to account for the patient's left upper quadrant abdominal pain. Electronically Signed   By:  Pinkie Pebbles M.D.   On: 05/11/2023 01:31    Procedures Procedures    Medications Ordered in ED Medications  ketorolac  (TORADOL ) injection 30 mg (has no administration in time range)  alum & mag hydroxide-simeth (MAALOX/MYLANTA) 200-200-20 MG/5ML  suspension 30 mL (30 mLs Oral Given 05/11/23 0035)  lidocaine  (XYLOCAINE ) 2 % viscous mouth solution 15 mL (15 mLs Mouth/Throat Given 05/11/23 0035)    ED Course/ Medical Decision Making/ A&P                                 Medical Decision Making Patient with LUQ since eating pasta with sauce and garlic bread, on left   Amount and/or Complexity of Data Reviewed Independent Historian: spouse    Details: See above  External Data Reviewed: notes.    Details: Previous notes reviewed  Labs: ordered.    Details: Pregnancy is negative urine is negative for UTI, normal sodium 139, and potassium 3.5 and creatinine.  Normal LFTS. Normal lipase 32.  Hemoglobin slight low 11.9, normal platelets  Radiology: ordered and independent interpretation performed.    Details: Normal CT by me   Risk OTC drugs. Prescription drug management. Risk Details: Well appearing with normal labs, exam and imaging.  I do not think this is gall bladder as labs are normal, as is exam and CT.  I believe this is likely GERD.  I have started a GERD friendly diet and Omeprazole .  Stable for discharge with close follow up.  Strict return     Final Clinical Impression(s) / ED Diagnoses Final diagnoses:  LUQ pain   Return for intractable cough, coughing up blood, fevers > 100.4 unrelieved by medication, shortness of breath, intractable vomiting, chest pain, shortness of breath, weakness, numbness, changes in speech, facial asymmetry, abdominal pain, passing out, Inability to tolerate liquids or food, cough, altered mental status or any concerns. No signs of systemic illness or infection. The patient is nontoxic-appearing on exam and vital signs are within normal limits.   I have reviewed the triage vital signs and the nursing notes. Pertinent labs & imaging results that were available during my care of the patient were reviewed by me and considered in my medical decision making (see chart for details). After history, exam, and medical workup I feel the patient has been appropriately medically screened and is safe for discharge home. Pertinent diagnoses were discussed with the patient. Patient was given return precautions.  Rx / DC Orders ED Discharge Orders          Ordered    omeprazole  (PRILOSEC) 20 MG capsule  Daily        05/11/23 0143              Wyoma Genson, MD 05/11/23 [phone]

## 2023-06-02 ENCOUNTER — Encounter: Payer: Self-pay | Admitting: Obstetrics and Gynecology

## 2023-06-02 ENCOUNTER — Other Ambulatory Visit: Payer: Self-pay | Admitting: Obstetrics and Gynecology

## 2023-06-02 DIAGNOSIS — I10 Essential (primary) hypertension: Secondary | ICD-10-CM

## 2023-11-11 ENCOUNTER — Encounter: Payer: Self-pay | Admitting: *Deleted

## 2023-11-11 ENCOUNTER — Ambulatory Visit (INDEPENDENT_AMBULATORY_CARE_PROVIDER_SITE_OTHER): Admitting: *Deleted

## 2023-11-11 DIAGNOSIS — Z3201 Encounter for pregnancy test, result positive: Secondary | ICD-10-CM

## 2023-11-11 DIAGNOSIS — Z3200 Encounter for pregnancy test, result unknown: Secondary | ICD-10-CM

## 2023-11-11 LAB — POCT PREGNANCY, URINE: Preg Test, Ur: POSITIVE — AB

## 2023-11-11 NOTE — Progress Notes (Signed)
 Established pt submitted urine for pregnancy test which is positive. I called her to discuss results and she did not answer. Message was left to check her Mychart for a message from me and to respond.   1225  Called pt following receipt of her Mychart response. I informed her of test result. She reports sure LMP 09/04/23 which yields EDD 06/10/24, now [redacted]w[redacted]d. Pt is feeling excited and has no c/o. She denies having vaginal bleeding or abdominal pain. She was advised to go to [facility] if she develops heavy vaginal bleeding or abdominal pain. Pt was advised to begin taking prenatal vitamins. She will be scheduled for prenatal care in this office and will receive notification of appts via Mychart. She voiced understanding.

## 2023-11-24 ENCOUNTER — Telehealth

## 2023-11-24 DIAGNOSIS — Z3A11 11 weeks gestation of pregnancy: Secondary | ICD-10-CM

## 2023-11-24 DIAGNOSIS — Z8759 Personal history of other complications of pregnancy, childbirth and the puerperium: Secondary | ICD-10-CM

## 2023-11-24 DIAGNOSIS — O0990 Supervision of high risk pregnancy, unspecified, unspecified trimester: Secondary | ICD-10-CM | POA: Insufficient documentation

## 2023-11-24 DIAGNOSIS — I10 Essential (primary) hypertension: Secondary | ICD-10-CM | POA: Insufficient documentation

## 2023-11-24 DIAGNOSIS — I159 Secondary hypertension, unspecified: Secondary | ICD-10-CM

## 2023-11-24 DIAGNOSIS — O0991 Supervision of high risk pregnancy, unspecified, first trimester: Secondary | ICD-10-CM

## 2023-11-24 NOTE — Progress Notes (Signed)
 New OB Intake  I connected with Deborah [name]  on 11/24/23 at  8:15 AM EDT by MyChart Video Visit and verified that I am speaking with the correct person using two identifiers. Nurse is located at [facility] and pt is located at home.  I discussed the limitations, risks, security and privacy concerns of performing an evaluation and management service by telephone and the availability of in person appointments. I also discussed with the patient that there may be a patient responsible charge related to this service. The patient expressed understanding and agreed to proceed.  I explained I am completing New OB Intake today. We discussed EDD of 06/10/2024 based on LMP of 0509/2025. Pt is G2P1001. I reviewed her allergies, medications and Medical/Surgical/OB history.    Patient Active Problem List   Diagnosis Date Noted   Hypertension 11/24/2023   History of gestational hypertension 11/24/2023   Supervision of high risk pregnancy, antepartum 11/24/2023   BMI 50.0-59.9, adult (HCC) 05/19/2022   History of Gestational diabetes mellitus, class A1 05/19/2022   Elevated hemoglobin A1c 04/04/2022     Concerns addressed today: -Hx of GDM with last pregnancy. Patient is aware she will have to do early 2 hours Glucola test.   Delivery Plans Plans to deliver at [facility] Discussed the nature of our practice with multiple providers including residents and students. Due to the size of the practice, the delivering provider may not be the same as those providing prenatal care.   Patient is not interested in water birth.  MyChart/Babyscripts MyChart access verified. I explained pt will have some visits in office and some virtually. Babyscripts instructions given and order placed. Patient verifies receipt of registration text/e-mail. Account successfully created and app downloaded. If patient is a candidate for Optimized scheduling, add to sticky note.   Blood Pressure Cuff/Weight Scale Patient has  private insurance; instructed to purchase blood pressure cuff and bring to first prenatal appt. Explained after first prenatal appt pt will check weekly and document in Babyscripts. Patient does not have weight scale; patient may purchase if they desire to track weight weekly in Babyscripts.  Anatomy US  Explained first scheduled US  will be around 19 weeks. Anatomy US  scheduled for 10/01/ at 9:00.  Is patient a CenteringPregnancy candidate?  Declined Declined due to Schedule  Is patient a Mom+Baby Combined Care candidate?  Not a candidate   If accepted, confirm patient does not intend to move from the area for at least 12 months, then notify Mom+Baby staff  Is patient a candidate for Babyscripts Optimization? Yes, patient declined   First visit review I reviewed new OB appt with patient. Explained pt will be seen by Nicholaus Sor MD at first visit. Discussed [place] genetic screening with patient. Yes  Panorama and Horizon 04/30/2022;Negative.. Routine prenatal labs is not   Last Pap Diagnosis  Date Value Ref Range Status  05/19/2022   Final   - Negative for intraepithelial lesion or malignancy (NILM)    Ermalinda DEEM, CMA 11/24/2023  8:37 AM

## 2023-12-08 ENCOUNTER — Encounter: Payer: Self-pay | Admitting: Obstetrics and Gynecology

## 2023-12-08 ENCOUNTER — Ambulatory Visit

## 2023-12-08 ENCOUNTER — Other Ambulatory Visit: Payer: Self-pay

## 2023-12-08 ENCOUNTER — Ambulatory Visit: Admitting: Obstetrics and Gynecology

## 2023-12-08 VITALS — BP 138/82 | HR 71 | Wt 329.0 lb

## 2023-12-08 DIAGNOSIS — O368390 Maternal care for abnormalities of the fetal heart rate or rhythm, unspecified trimester, not applicable or unspecified: Secondary | ICD-10-CM | POA: Diagnosis not present

## 2023-12-08 DIAGNOSIS — Z3201 Encounter for pregnancy test, result positive: Secondary | ICD-10-CM | POA: Diagnosis not present

## 2023-12-08 DIAGNOSIS — Z3A00 Weeks of gestation of pregnancy not specified: Secondary | ICD-10-CM | POA: Diagnosis not present

## 2023-12-08 DIAGNOSIS — I159 Secondary hypertension, unspecified: Secondary | ICD-10-CM | POA: Diagnosis not present

## 2023-12-08 DIAGNOSIS — O09899 Supervision of other high risk pregnancies, unspecified trimester: Secondary | ICD-10-CM | POA: Insufficient documentation

## 2023-12-08 DIAGNOSIS — Z1332 Encounter for screening for maternal depression: Secondary | ICD-10-CM

## 2023-12-08 LAB — POCT PREGNANCY, URINE: Preg Test, Ur: POSITIVE — AB

## 2023-12-08 NOTE — Progress Notes (Signed)
   GYNECOLOGY OFFICE NOTE  History:  28 y.o. G2P1001 here today for NOB visit. Last period was in April, then missed period in June, hasd positive home pregnancy test at beginning of July, positive UPT here in office in July as well. No bleeding or other complaints.  Past Medical History:  Diagnosis Date   Elevated blood pressure reading - suspected cHTN 04/04/2022   Elevated BP at RN visit for UPT, no prior dx of HTN  Baseline P/C 0.176, serum labs WNL  BP check scheduled 12/14 for confirmation of cHTN diagnosis   Gestational diabetes mellitus, class A1 05/19/2022   Hypertension    Pre-diabetes 2024    Past Surgical History:  Procedure Laterality Date   MANDIBLE SURGERY Right 2018     Current Outpatient Medications:    NIFEdipine  (ADALAT  CC) 60 MG 24 hr tablet, TAKE 1 TABLET BY MOUTH EVERY DAY, Disp: 90 tablet, Rfl: 0   omeprazole  (PRILOSEC) 20 MG capsule, Take 1 capsule (20 mg total) by mouth daily., Disp: 30 capsule, Rfl: 0   prenatal vitamin w/FE, FA (PRENATAL 1 + 1) 27-1 MG TABS tablet, Take 1 tablet by mouth daily at 12 noon., Disp: , Rfl:   The following portions of the patient's history were reviewed and updated as appropriate: allergies, current medications, past family history, past medical history, past social history, past surgical history and problem list.   Review of Systems:  Pertinent items noted in HPI and remainder of comprehensive ROS otherwise negative.   Objective:  Physical Exam BP 138/82   Pulse 71   Wt (!) 329 lb (149.2 kg)   LMP 09/04/2023   BMI 51.53 kg/m  CONSTITUTIONAL: Well-developed, well-nourished female in no acute distress.  HENT:  Normocephalic, atraumatic. External right and left ear normal. Oropharynx is clear and moist EYES: Conjunctivae and EOM are normal. Pupils are equal, round, and reactive to light. No scleral icterus.  NECK: Normal range of motion, supple, no masses SKIN: Skin is warm and dry. No rash noted. Not diaphoretic. No  erythema. No pallor. NEUROLOGIC: Alert and oriented to person, place, and time. Normal reflexes, muscle tone coordination. No cranial nerve deficit noted. PSYCHIATRIC: Normal mood and affect. Normal behavior. Normal judgment and thought content. CARDIOVASCULAR: Normal heart rate noted RESPIRATORY: Effort normal, no problems with respiration noted ABDOMEN: Soft, no distention noted.   PELVIC: deferred MUSCULOSKELETAL: Normal range of motion. No edema noted.  Bedside US : no gestational sac noted, endometrium with abnormal appearing stripe  Labs and Imaging No results found.  Assessment & Plan:   1. Secondary hypertension Cont nifedipine  60 mg XL  2. Ultrasound scan done for inability to hear fetal heart tones - US  OB LESS THAN 14 WEEKS WITH OB TRANSVAGINAL; Future  3. Positive pregnancy test (Primary) Unable to see IUP or gestational sac on bedside US , sent for formal US  - irregular endometrium noted on US  here today, suspect MAB, less likely molar vs viable pregnancy - reviewed the above with patient, need more info and will obtain HCG today and repeat in 2 days, then will determine follow up - pt verbalizes understanding and is in agreement with plan - precautions given - Beta hCG quant (ref lab)   Routine preventative health maintenance measures emphasized. Please refer to After Visit Summary for other counseling recommendations.   Return in about 2 days (around 12/10/2023) for HCG stat.   LOIS Yolanda Moats, MD, [facility] Attending Center for [facility])

## 2023-12-09 ENCOUNTER — Ambulatory Visit: Payer: Self-pay | Admitting: Obstetrics and Gynecology

## 2023-12-09 LAB — BETA HCG QUANT (REF LAB): hCG Quant: 37486 m[IU]/mL

## 2023-12-10 ENCOUNTER — Telehealth: Payer: Self-pay

## 2023-12-10 ENCOUNTER — Encounter (HOSPITAL_COMMUNITY): Payer: Self-pay | Admitting: Obstetrics and Gynecology

## 2023-12-10 ENCOUNTER — Other Ambulatory Visit: Payer: Self-pay

## 2023-12-10 ENCOUNTER — Ambulatory Visit: Admitting: *Deleted

## 2023-12-10 VITALS — BP 144/78 | HR 79 | Wt 329.3 lb

## 2023-12-10 DIAGNOSIS — Z3A14 14 weeks gestation of pregnancy: Secondary | ICD-10-CM

## 2023-12-10 DIAGNOSIS — O3680X0 Pregnancy with inconclusive fetal viability, not applicable or unspecified: Secondary | ICD-10-CM

## 2023-12-10 LAB — BETA HCG QUANT (REF LAB): hCG Quant: 35339 m[IU]/mL

## 2023-12-10 NOTE — Telephone Encounter (Signed)
 I called patient to provide surgery details for 12/11/23 at [facility]. Patient is aware she bust arrive for pre-op at 10:45 am and surgery will begin at 12:45 pm. Pre-op instructions were provided by phone.

## 2023-12-10 NOTE — Progress Notes (Signed)
 SDW CALL  Patient was given pre-op instructions over the phone. The opportunity was given for the patient to ask questions. No further questions asked. Patient verbalized understanding of instructions given.   PCP - [facility] [address] Cardiologist - denies  PPM/ICD - denies   Chest x-ray - denies EKG - DOS Stress Test - denies ECHO -  denies Cardiac Cath -   Sleep Study - denies  Pre-diabetes and does not check blood sugar at home  Last dose of GLP1 agonist-  n/a GLP1 instructions:  n/a  Blood Thinner Instructions: n/a Aspirin  Instructions: n/a  ERAS Protcol - clears until 1000 PRE-SURGERY Ensure or G2- n/a  COVID TEST- no   Anesthesia review: no  Patient denies shortness of breath, fever, cough and chest pain over the phone call   All instructions explained to the patient, with a verbal understanding of the material. Patient agrees to go over the instructions while at home for a better understanding.

## 2023-12-10 NOTE — Progress Notes (Cosign Needed Addendum)
 Ms. Deborah [name] presents to [facility] for follow-up quant hCG blood draw today. She was seen here on 12/08/2023.  Patient endorses very slight pain that she describes as very mild cramping at times.  She had spotting the night of the 12th that she only saw when she wiped.  It was light pink in color.   Discussed with patient that we are following hCG levels today. Results will be back in a few hours.  Valid contact number for patient confirmed. I will call the patient with results.   Results and chart reviewed with Dr. Izell before calling the patient.  hCG was 37,486 on 08/12 and is now 35,339 today.  He recommends a D&C procedure.  Called and discussed with patient her lab value as well as the provider's recommended plan of care and that our office will be calling her to schedule the procedure.  She expressed understanding and had no further questions.    Rosina, RN BSN

## 2023-12-11 ENCOUNTER — Ambulatory Visit (HOSPITAL_COMMUNITY)

## 2023-12-11 ENCOUNTER — Ambulatory Visit (HOSPITAL_COMMUNITY): Admitting: Anesthesiology

## 2023-12-11 ENCOUNTER — Encounter (HOSPITAL_COMMUNITY)
Admission: RE | Disposition: A | Discharge status: Home / Self Care | Payer: Self-pay | Source: Home / Self Care | Attending: Obstetrics and Gynecology

## 2023-12-11 ENCOUNTER — Ambulatory Visit (HOSPITAL_COMMUNITY)
Admission: RE | Admit: 2023-12-11 | Discharge: 2023-12-11 | Disposition: A | Discharge status: Home / Self Care | Attending: Obstetrics and Gynecology | Admitting: Obstetrics and Gynecology

## 2023-12-11 ENCOUNTER — Other Ambulatory Visit: Payer: Self-pay

## 2023-12-11 DIAGNOSIS — I10 Essential (primary) hypertension: Secondary | ICD-10-CM | POA: Diagnosis not present

## 2023-12-11 DIAGNOSIS — O020 Blighted ovum and nonhydatidiform mole: Secondary | ICD-10-CM

## 2023-12-11 DIAGNOSIS — Z79899 Other long term (current) drug therapy: Secondary | ICD-10-CM | POA: Diagnosis not present

## 2023-12-11 DIAGNOSIS — E119 Type 2 diabetes mellitus without complications: Secondary | ICD-10-CM | POA: Diagnosis not present

## 2023-12-11 DIAGNOSIS — O029 Abnormal product of conception, unspecified: Secondary | ICD-10-CM

## 2023-12-11 DIAGNOSIS — Z3A14 14 weeks gestation of pregnancy: Secondary | ICD-10-CM

## 2023-12-11 DIAGNOSIS — K219 Gastro-esophageal reflux disease without esophagitis: Secondary | ICD-10-CM | POA: Diagnosis not present

## 2023-12-11 HISTORY — PX: DILATION AND EVACUATION: SHX1459

## 2023-12-11 LAB — BASIC METABOLIC PANEL WITH GFR
Anion gap: 13 (ref 5–15)
BUN: 8 mg/dL (ref 6–20)
CO2: 18 mmol/L — ABNORMAL LOW (ref 22–32)
Calcium: 9.1 mg/dL (ref 8.9–10.3)
Chloride: 106 mmol/L (ref 98–111)
Creatinine, Ser: 0.65 mg/dL (ref 0.44–1.00)
GFR, Estimated: >60 mL/min (ref >60)
Glucose, Bld: 86 mg/dL (ref 70–99)
Potassium: 3.7 mmol/L (ref 3.5–5.1)
Sodium: 137 mmol/L (ref 135–145)

## 2023-12-11 LAB — CBC
HCT: 42.7 % (ref 36.0–46.0)
Hemoglobin: 13.8 g/dL (ref 12.0–15.0)
MCH: 27.2 pg (ref 26.0–34.0)
MCHC: 32.3 g/dL (ref 30.0–36.0)
MCV: 84.2 fL (ref 80.0–100.0)
Platelets: 355 K/uL (ref 150–400)
RBC: 5.07 MIL/uL (ref 3.87–5.11)
RDW: 13.7 % (ref 11.5–15.5)
WBC: 9.6 K/uL (ref 4.0–10.5)
nRBC: 0 % (ref 0.0–0.2)

## 2023-12-11 LAB — TYPE AND SCREEN
ABO/RH(D): O POS
Antibody Screen: NEGATIVE

## 2023-12-11 LAB — GLUCOSE, CAPILLARY: Glucose-Capillary: 95 mg/dL (ref 70–99)

## 2023-12-11 SURGERY — DILATION AND EVACUATION, UTERUS
Anesthesia: General | Site: Uterus

## 2023-12-11 MED ORDER — DOXYCYCLINE HYCLATE 100 MG IV SOLR
200.0000 mg | INTRAVENOUS | Status: AC
  Administered 2023-12-11: 200 mg via INTRAVENOUS
  Filled 2023-12-11: qty 200

## 2023-12-11 MED ORDER — LACTATED RINGERS IV SOLN: INTRAVENOUS | Status: DC

## 2023-12-11 MED ORDER — CHLORHEXIDINE GLUCONATE 0.12 % MT SOLN
15.0000 mL | Freq: Once | OROMUCOSAL | Status: AC
  Administered 2023-12-11: 15 mL via OROMUCOSAL
  Filled 2023-12-11: qty 15

## 2023-12-11 MED ORDER — OXYCODONE HCL 5 MG/5ML PO SOLN: 5.0000 mg | Freq: Once | ORAL | Status: DC | PRN

## 2023-12-11 MED ORDER — FENTANYL CITRATE (PF) 250 MCG/5ML IJ SOLN
INTRAMUSCULAR | Status: DC | PRN
  Administered 2023-12-11: 50 ug via INTRAVENOUS

## 2023-12-11 MED ORDER — ORAL CARE MOUTH RINSE: 15.0000 mL | Freq: Once | OROMUCOSAL | Status: AC

## 2023-12-11 MED ORDER — HYDROMORPHONE HCL 1 MG/ML IJ SOLN: 0.2500 mg | INTRAMUSCULAR | Status: DC | PRN

## 2023-12-11 MED ORDER — LIDOCAINE 2% (20 MG/ML) 5 ML SYRINGE
INTRAMUSCULAR | Status: DC | PRN
  Administered 2023-12-11: 100 mg via INTRAVENOUS

## 2023-12-11 MED ORDER — MIDAZOLAM HCL 2 MG/2ML IJ SOLN
INTRAMUSCULAR | Status: DC | PRN
  Administered 2023-12-11: 2 mg via INTRAVENOUS

## 2023-12-11 MED ORDER — ROCURONIUM BROMIDE 10 MG/ML (PF) SYRINGE
PREFILLED_SYRINGE | INTRAVENOUS | Status: AC
  Filled 2023-12-11: qty 10

## 2023-12-11 MED ORDER — ONDANSETRON HCL 4 MG/2ML IJ SOLN
INTRAMUSCULAR | Status: AC
  Filled 2023-12-11: qty 2

## 2023-12-11 MED ORDER — DEXAMETHASONE SODIUM PHOSPHATE 10 MG/ML IJ SOLN
INTRAMUSCULAR | Status: AC
  Filled 2023-12-11: qty 1

## 2023-12-11 MED ORDER — SUGAMMADEX SODIUM 200 MG/2ML IV SOLN
INTRAVENOUS | Status: DC | PRN
  Administered 2023-12-11: 200 mg via INTRAVENOUS

## 2023-12-11 MED ORDER — DROPERIDOL 2.5 MG/ML IJ SOLN: 0.6250 mg | Freq: Once | INTRAMUSCULAR | Status: DC | PRN

## 2023-12-11 MED ORDER — DEXAMETHASONE SODIUM PHOSPHATE 10 MG/ML IJ SOLN
INTRAMUSCULAR | Status: DC | PRN
  Administered 2023-12-11: 5 mg via INTRAVENOUS

## 2023-12-11 MED ORDER — OXYCODONE HCL 5 MG PO TABS: 5.0000 mg | ORAL_TABLET | Freq: Once | ORAL | Status: DC | PRN

## 2023-12-11 MED ORDER — ACETAMINOPHEN 500 MG PO TABS: 1000.0000 mg | ORAL_TABLET | Freq: Once | ORAL | Status: DC

## 2023-12-11 MED ORDER — ROCURONIUM BROMIDE 10 MG/ML (PF) SYRINGE
PREFILLED_SYRINGE | INTRAVENOUS | Status: DC | PRN
  Administered 2023-12-11: 20 mg via INTRAVENOUS

## 2023-12-11 MED ORDER — 0.9 % SODIUM CHLORIDE (POUR BTL) OPTIME
TOPICAL | Status: DC | PRN
  Administered 2023-12-11: 1000 mL

## 2023-12-11 MED ORDER — SOD CITRATE-CITRIC ACID 500-334 MG/5ML PO SOLN
30.0000 mL | ORAL | Status: AC
  Administered 2023-12-11: 30 mL via ORAL
  Filled 2023-12-11: qty 30

## 2023-12-11 MED ORDER — ACETAMINOPHEN 500 MG PO TABS
1000.0000 mg | ORAL_TABLET | ORAL | Status: AC
  Administered 2023-12-11: 1000 mg via ORAL
  Filled 2023-12-11: qty 2

## 2023-12-11 MED ORDER — KETOROLAC TROMETHAMINE 30 MG/ML IJ SOLN
INTRAMUSCULAR | Status: DC | PRN
  Administered 2023-12-11: 30 mg via INTRAVENOUS

## 2023-12-11 MED ORDER — PROPOFOL 10 MG/ML IV BOLUS
INTRAVENOUS | Status: DC | PRN
  Administered 2023-12-11: 200 mg via INTRAVENOUS

## 2023-12-11 MED ORDER — MIDAZOLAM HCL 2 MG/2ML IJ SOLN
INTRAMUSCULAR | Status: AC
  Filled 2023-12-11: qty 2

## 2023-12-11 MED ORDER — KETOROLAC TROMETHAMINE 30 MG/ML IJ SOLN
INTRAMUSCULAR | Status: AC
  Filled 2023-12-11: qty 1

## 2023-12-11 MED ORDER — SUCCINYLCHOLINE CHLORIDE 200 MG/10ML IV SOSY
PREFILLED_SYRINGE | INTRAVENOUS | Status: DC | PRN
  Administered 2023-12-11: 160 mg via INTRAVENOUS

## 2023-12-11 MED ORDER — FENTANYL CITRATE (PF) 250 MCG/5ML IJ SOLN
INTRAMUSCULAR | Status: AC
  Filled 2023-12-11: qty 5

## 2023-12-11 MED ORDER — IBUPROFEN 600 MG PO TABS: 600.0000 mg | ORAL_TABLET | Freq: Four times a day (QID) | ORAL | 2 refills | Status: AC | PRN

## 2023-12-11 MED ORDER — CELECOXIB 200 MG PO CAPS
200.0000 mg | ORAL_CAPSULE | Freq: Once | ORAL | Status: AC
  Administered 2023-12-11: 200 mg via ORAL
  Filled 2023-12-11: qty 1

## 2023-12-11 MED ORDER — PROPOFOL 10 MG/ML IV BOLUS
INTRAVENOUS | Status: AC
  Filled 2023-12-11: qty 20

## 2023-12-11 MED ORDER — TRANEXAMIC ACID-NACL 1000-0.7 MG/100ML-% IV SOLN
1000.0000 mg | Freq: Once | INTRAVENOUS | Status: AC
  Administered 2023-12-11: 1000 mg via INTRAVENOUS
  Filled 2023-12-11: qty 100

## 2023-12-11 MED ORDER — LIDOCAINE 2% (20 MG/ML) 5 ML SYRINGE
INTRAMUSCULAR | Status: AC
  Filled 2023-12-11: qty 5

## 2023-12-11 MED ORDER — POVIDONE-IODINE 10 % EX SWAB
2.0000 | Freq: Once | CUTANEOUS | Status: AC
  Administered 2023-12-11: 2 via TOPICAL

## 2023-12-11 SURGICAL SUPPLY — 18 items
CATH ROBINSON RED A/P 16FR (CATHETERS) IMPLANT
COVER MAYO STAND STRL (DRAPES) ×1 IMPLANT
FILTER UTR ASPR ASSEMBLY (MISCELLANEOUS) ×1 IMPLANT
GLOVE SURG ORTHO 8.0 STRL STRW (GLOVE) ×1 IMPLANT
GOWN STRL REUS W/ TWL XL LVL3 (GOWN DISPOSABLE) ×2 IMPLANT
HOSE CONNECTING 18IN BERKELEY (TUBING) ×1 IMPLANT
KIT BERKELEY 1ST TRI 3/8 NO TR (MISCELLANEOUS) ×1 IMPLANT
KIT BERKELEY 1ST TRIMESTER 3/8 (MISCELLANEOUS) ×1 IMPLANT
NS IRRIG 1000ML POUR BTL (IV SOLUTION) ×1 IMPLANT
PACK VAGINAL MINOR WOMEN LF (CUSTOM PROCEDURE TRAY) ×1 IMPLANT
PAD OB MATERNITY 11 LF (PERSONAL CARE ITEMS) ×1 IMPLANT
SET BERKELEY SUCTION TUBING (SUCTIONS) ×1 IMPLANT
TOWEL GREEN STERILE FF (TOWEL DISPOSABLE) ×1 IMPLANT
UNDERPAD 30X36 HEAVY ABSORB (UNDERPADS AND DIAPERS) ×1 IMPLANT
VACURETTE 10 RIGID CVD (CANNULA) IMPLANT
VACURETTE 7MM CVD STRL WRAP (CANNULA) IMPLANT
VACURETTE 8 RIGID CVD (CANNULA) IMPLANT
VACURETTE 9 RIGID CVD (CANNULA) IMPLANT

## 2023-12-11 NOTE — H&P (Signed)
 OB/GYN Pre-Op History and Physical  [name] is a 28 y.o. G2P1001 presenting for suction dilation and evacuation due to suspected molar pregnancy seen on ultrasound with increased bhcg levels. Pt has history of successful pregnancy with vaginal delivery.  Risks and benefits of the procedure given including bleeding, infection, involvement of other organs as well as uterine perforation.  Due to vascular nature of the lesion we will try to have ultrasound guidance.      Past Medical History:  Diagnosis Date   Elevated blood pressure reading - suspected cHTN 04/04/2022   Elevated BP at RN visit for UPT, no prior dx of HTN  Baseline P/C 0.176, serum labs WNL  BP check scheduled 12/14 for confirmation of cHTN diagnosis   Gestational diabetes mellitus, class A1 05/19/2022   Hypertension    Pre-diabetes 2024    Past Surgical History:  Procedure Laterality Date   MANDIBLE SURGERY Right 2018    OB History  Gravida Para Term Preterm AB Living  2 1 1   1   SAB IAB Ectopic Multiple Live Births     0 1    # Outcome Date GA Lbr Len/2nd Weight Sex Type Anes PTL Lv  2 Current           1 Term 11/16/22 [redacted]w[redacted]d / 00:17 2800 g M Vag-Spont EPI  LIV     Complications: Gestational diabetes, Gestational hypertension    Social History   Socioeconomic History   Marital status: Single    Spouse name: Not on file   Number of children: Not on file   Years of education: Not on file   Highest education level: Some college, no degree  Occupational History   Not on file  Tobacco Use   Smoking status: Never    Passive exposure: Never   Smokeless tobacco: Never  Vaping Use   Vaping status: Never Used  Substance and Sexual Activity   Alcohol use: Not Currently   Drug use: Never   Sexual activity: Yes    Birth control/protection: None  Other Topics Concern   Not on file  Social History Narrative   Not on file   Social Drivers of Health   Financial Resource Strain: Low Risk   (06/09/2022)   Overall Financial Resource Strain (CARDIA)    Difficulty of Paying Living Expenses: Not hard at all  Food Insecurity: No Food Insecurity (12/08/2023)   Hunger Vital Sign    Worried About Running Out of Food in the Last Year: Never true    Ran Out of Food in the Last Year: Never true  Transportation Needs: No Transportation Needs (11/14/2022)   PRAPARE - Administrator, Civil Service (Medical): No    Lack of Transportation (Non-Medical): No  Physical Activity: Sufficiently Active (06/09/2022)   Exercise Vital Sign    Days of Exercise per Week: 5 days    Minutes of Exercise per Session: 30 min  Stress: No Stress Concern Present (06/09/2022)   [employer] of Occupational Health - Occupational Stress Questionnaire    Feeling of Stress : Not at all  Social Connections: Moderately Integrated (06/09/2022)   Social Connection and Isolation Panel    Frequency of Communication with Friends and Family: More than three times a week    Frequency of Social Gatherings with Friends and Family: More than three times a week    Attends Religious Services: 1 to 4 times per year    Active Member of Clubs or Organizations: No  Attends Banker Meetings: Not on file    Marital Status: Living with partner    Family History  Problem Relation Age of Onset   Healthy Mother    Healthy Father     Medications Prior to Admission  Medication Sig Dispense Refill Last Dose/Taking   NIFEdipine  (ADALAT  CC) 60 MG 24 hr tablet TAKE 1 TABLET BY MOUTH EVERY DAY 90 tablet 0 12/11/2023 at  9:00 AM   omeprazole  (PRILOSEC) 20 MG capsule Take 1 capsule (20 mg total) by mouth daily. 30 capsule 0 Past Month   prenatal vitamin w/FE, FA (PRENATAL 1 + 1) 27-1 MG TABS tablet Take 1 tablet by mouth daily at 12 noon.   12/10/2023    No Known Allergies  Review of Systems: Negative except for what is mentioned in HPI.     Physical Exam: BP 130/77   Pulse 72   Temp 98.1 F (36.7 C)  (Oral)   Resp 20   Ht 5' 7 (1.702 m)   Wt (!) 149.3 kg   LMP 09/04/2023   SpO2 98%   BMI 51.55 kg/m  CONSTITUTIONAL: Well-developed, obese, well-nourished female in no acute distress.  HENT:  Normocephalic, atraumatic, External right and left ear normal. Oropharynx is clear and moist EYES: Conjunctivae and EOM are normal.  NECK: Normal range of motion, supple, no masses SKIN: Skin is warm and dry. No rash noted. Not diaphoretic. No erythema. No pallor. NEUROLGIC: Alert and oriented to person, place, and time. Normal reflexes, muscle tone coordination. No cranial nerve deficit noted. PSYCHIATRIC: Normal mood and affect. Normal behavior. Normal judgment and thought content. CARDIOVASCULAR: Normal heart rate noted, regular rhythm RESPIRATORY: Effort and breath sounds normal, no problems with respiration noted ABDOMEN: Soft, nontender, nondistended, obese. PELVIC: Deferred MUSCULOSKELETAL: Normal range of motion. No edema and no tenderness. 2+ distal pulses.   Pertinent Labs/Studies:   Results for orders placed or performed during the hospital encounter of 12/11/23 (from the past 72 hours)  Glucose, capillary     Status: None   Collection Time: 12/11/23 11:03 AM  Result Value Ref Range   Glucose-Capillary 95 70 - 99 mg/dL    Comment: Glucose reference range applies only to samples taken after fasting for at least 8 hours.  Type and screen [facility]     Status: None (Preliminary result)   Collection Time: 12/11/23 11:54 AM  Result Value Ref Range   ABO/RH(D) PENDING    Antibody Screen PENDING    Sample Expiration      12/14/2023,2359 Performed at [facility], [address] [place], [place] [address]    ----------------------------------------------------------------------  OBSTETRICS REPORT                       (Signed Final 12/10/2023 11:48 am) ---------------------------------------------------------------------- Patient Info    ID #:        [id number]                          D.O.B.:  July 02, 1995 (28 yrs)(F)  Name:       Deborah [name]                Visit Date: 12/08/2023 05:47 pm              [place] ---------------------------------------------------------------------- Performed By    Attending:        Vina Solian MD        Ref. Address:  [address]                                                             [place], [place]                                                             [address]  Performed By:     Scarlet Flesher         Location[facility]         [facility]  Referred By:      [facility] ---------------------------------------------------------------------- Orders    #  Description                           Code        Ordered By  1  US  OB LESS THAN 14 WEEKS              [id number]     KELLY DAVIS     WITH OB TRANSVAGINAL ----------------------------------------------------------------------    #  Order #                     Accession #                Episode #  1  [id number]                   [id number]                 [id number] ---------------------------------------------------------------------- Indications    Weeks  of gestation of pregnancy not            Z3A.00  specified  Unable to hear fetal heart tones as reason     O76  for ultrasound ---------------------------------------------------------------------- Fetal Evaluation    Num Of Fetuses:         1  Gest. Sac:              None seen  Yolk Sac:               Not visualized  Fetal Pole:             Not visualized  Cardiac Activity:       No embryo visualized    Comment:    Abnormal appearing endo. ? cystic clusterlike area ? Molar               pregnancy? No vascularity noted within endo to suggest RPOC ---------------------------------------------------------------------- Impression    Thickened endometrial lining with cystic features within the  endometrium. No vascularity within the endometrium  suspicious for retained products of conception but cannot rule  out molar pregnancy. ---------------------------------------------------------------------- Recommendations  Patient has follow up HCG level today. Prior quantitative HCG  was 37,486. Await HCG level to determine next management  step. Would have low threshold for dilation and evacuation  over medical management due to possibility of molar  pregnancy. Would also recommend ANORA genetic testing  on products of conception. ----------------------------------------------------------------------                 Vina Solian, MD Electronically Signed Final Report   12/10/2023 11:48 am ----------------------------------------------------------------------     Assessment and Plan :Vibha Ferdig is a 28 y.o. G2P1001 here for suction dilation and evacuation.   Plan for suction D and E Risks and benefits of the procedure given as above. Will give TXA prophylactically and use ultrasound guidance for the procedure. NPO Admission labs ordered VS Q4    Jerilynn Buddle, M.D. Attending Obstetrician & Gynecologist, [facility] for [employer], [facility]

## 2023-12-11 NOTE — Anesthesia Preprocedure Evaluation (Addendum)
 Anesthesia Evaluation  Patient identified by MRN, date of birth, ID band Patient awake    Reviewed: Allergy & Precautions, NPO status , Patient's Chart, lab work & pertinent test results  Airway Mallampati: III  TM Distance: >3 FB Neck ROM: Full    Dental no notable dental hx.    Pulmonary neg pulmonary ROS   Pulmonary exam normal        Cardiovascular hypertension,  Rhythm:Regular Rate:Normal     Neuro/Psych negative neurological ROS  negative psych ROS   GI/Hepatic Neg liver ROS,GERD  Medicated,,  Endo/Other  diabetes  Class 4 obesity  Renal/GU negative Renal ROS  Female GU complaint     Musculoskeletal negative musculoskeletal ROS (+)    Abdominal  (+) + obese  Peds  Hematology Lab Results      Component                Value               Date                      WBC                      11.7 (H)            05/10/2023                HGB                      11.9 (L)            05/10/2023                HCT                      36.8                05/10/2023                MCV                      81.4                05/10/2023                PLT                      357                 05/10/2023             Lab Results      Component                Value               Date                      NA                       139                 05/10/2023                K                        3.5  05/10/2023                CO2                      24                  05/10/2023                GLUCOSE                  116 (H)             05/10/2023                BUN                      17                  05/10/2023                CREATININE               0.90                05/10/2023                CALCIUM                  8.9                 05/10/2023                EGFR                     118                 04/30/2022                GFRNONAA                 >60                 05/10/2023               Anesthesia Other Findings   Reproductive/Obstetrics (+) Pregnancy Molar pregnancy                               Anesthesia Physical Anesthesia Plan  ASA: 3  Anesthesia Plan: General   Post-op Pain Management: Tylenol  PO (pre-op)* and Celebrex  PO (pre-op)*   Induction: Intravenous  PONV Risk Score and Plan: 3 and Ondansetron , Dexamethasone , Midazolam  and Treatment may vary due to age or medical condition  Airway Management Planned: Mask and Oral ETT  Additional Equipment: None  Intra-op Plan:   Post-operative Plan: Extubation in OR  Informed Consent: I have reviewed the patients History and Physical, chart, labs and discussed the procedure including the risks, benefits and alternatives for the proposed anesthesia with the patient or authorized representative who has indicated his/her understanding and acceptance.     Dental advisory given  Plan Discussed with: CRNA  Anesthesia Plan Comments:          Anesthesia Quick Evaluation

## 2023-12-11 NOTE — Transfer of Care (Signed)
 Immediate Anesthesia Transfer of Care Note  Patient: Kalese Ensz  Procedure(s) Performed: DILATION AND EVACUATION, UTERUS (Uterus)  Patient Location: PACU  Anesthesia Type:General  Level of Consciousness: sedated  Airway & Oxygen Therapy: Patient Spontanous Breathing and Patient connected to face mask oxygen  Post-op Assessment: Report given to RN and Post -op Vital signs reviewed and stable  Post vital signs: Reviewed and stable  Last Vitals:  Vitals Value Taken Time  BP    Temp    Pulse 77 12/11/23 13:58  Resp 0 12/11/23 13:58  SpO2 100 % 12/11/23 13:58  Vitals shown include unfiled device data.  Last Pain:  Vitals:   12/11/23 1203  TempSrc:   PainSc: 0-No pain         Complications: No notable events documented.

## 2023-12-11 NOTE — Op Note (Signed)
 Deborah [name] PROCEDURE DATE: 12/11/2023  PREOPERATIVE DIAGNOSIS: 14 week missed abortion, possible molar pregnancy POSTOPERATIVE DIAGNOSIS: The same PROCEDURE:     Suction Dilation and Evacuation with ultrasound guidance SURGEON:  Jerilynn Buddle, MD  INDICATIONS: 28 y.o. G2P1001 with ultrasound findings, suspicious for molar pregnancy [redacted] weeks gestation by dates, needing surgical completion.  Risks of surgery were discussed with the patient including but not limited to: bleeding which may require transfusion; infection which may require antibiotics; injury to uterus or surrounding organs; need for additional procedures including laparotomy or laparoscopy; possibility of intrauterine scarring which may impair future fertility; and other postoperative/anesthesia complications. Written informed consent was obtained.    FINDINGS:  A 13 week size uterus, moderate amounts of products of conception, specimen sent to pathology.  ANESTHESIA: General LMA, paracervical block. INTRAVENOUS FLUIDS:  300 ml of LR ESTIMATED BLOOD LOSS:  Less than 30 ml. UOP: 100 ml SPECIMENS:  Products of conception sent to pathology and some products of conception were sent for [facility] genetic analysis COMPLICATIONS:  None immediate.  PROCEDURE DETAILS:  The patient received intravenous Doxycycline  while in the preoperative area.  She was then taken to the operating room where general anesthesia was administered and was found to be adequate.  After an adequate timeout was performed, she was placed in the dorsal lithotomy position and examined; then prepped and draped in the sterile manner.    A vaginal speculum was then placed in the patient's vagina and a single tooth tenaculum was applied to the anterior lip of the cervix.   The cervix was gently dilated to accommodate a 9 mm suction curette that was gently advanced to the uterine fundus.  This was done with ultrasound guidance. The suction device was then activated and  curette slowly rotated to clear the uterus of products of conception.  Suction curettage was done until complete emptying of the uterus was confirmed both with material in the tubing as well as visually confirmed on ultrasound.  A gentle sharp curettage was performed which did not retrieve any further material.   There was minimal bleeding noted and the tenaculum removed with good hemostasis noted.   All instruments were removed from the patient's vagina.  Sponge and instrument counts were correct times two  The patient tolerated the procedure well and was taken to the recovery area extubated, awake, and in stable condition.  The patient will be discharged to home as per PACU criteria.  Routine postoperative instructions given.  She was prescribed  Ibuprofen .  She will follow up in the office in 2-3 weeks for postoperative evaluation  Jerilynn Buddle, MD, FACOG Obstetrician & Gynecologist, [facility] for [facility], [facility]

## 2023-12-11 NOTE — Anesthesia Procedure Notes (Signed)
 Procedure Name: Intubation Date/Time: 12/11/2023 1:18 PM  Performed by: Boyce Shilling, CRNAPre-anesthesia Checklist: Patient identified, Emergency Drugs available, Suction available, Timeout performed and Patient being monitored Patient Re-evaluated:Patient Re-evaluated prior to induction Oxygen Delivery Method: Circle system utilized Preoxygenation: Pre-oxygenation with 100% oxygen Induction Type: IV induction Ventilation: Mask ventilation without difficulty Laryngoscope Size: Mac and 4 Grade View: Grade I Tube type: Oral Tube size: 7.0 mm Number of attempts: 1 Airway Equipment and Method: Stylet Placement Confirmation: ETT inserted through vocal cords under direct vision, positive ETCO2, CO2 detector and breath sounds checked- equal and bilateral Secured at: 22 cm Tube secured with: Tape Dental Injury: Teeth and Oropharynx as per pre-operative assessment

## 2023-12-12 ENCOUNTER — Ambulatory Visit: Payer: Self-pay | Admitting: Obstetrics and Gynecology

## 2023-12-14 ENCOUNTER — Encounter (HOSPITAL_COMMUNITY): Payer: Self-pay | Admitting: Obstetrics and Gynecology

## 2023-12-14 LAB — SURGICAL PATHOLOGY

## 2023-12-14 NOTE — Anesthesia Postprocedure Evaluation (Signed)
 Anesthesia Post Note  Patient: Deborah [name]  Procedure(s) Performed: DILATION AND EVACUATION, UTERUS (Uterus)     Patient location during evaluation: PACU Anesthesia Type: General Level of consciousness: awake and alert Pain management: pain level controlled Vital Signs Assessment: post-procedure vital signs reviewed and stable Respiratory status: spontaneous breathing, nonlabored ventilation, respiratory function stable and patient connected to nasal cannula oxygen Cardiovascular status: blood pressure returned to baseline and stable Postop Assessment: no apparent nausea or vomiting Anesthetic complications: no   No notable events documented.  Last Vitals:  Vitals:   12/11/23 1415 12/11/23 1430  BP: 119/66 127/68  Pulse: 70 71  Resp: 17 14  Temp:  36.6 C  SpO2: 100% 97%    Last Pain:  Vitals:   12/11/23 1430  TempSrc:   PainSc: 0-No pain                 Cordella P Jayan Raymundo

## 2023-12-15 ENCOUNTER — Ambulatory Visit: Payer: Self-pay | Admitting: Obstetrics and Gynecology

## 2023-12-15 DIAGNOSIS — O3680X0 Pregnancy with inconclusive fetal viability, not applicable or unspecified: Secondary | ICD-10-CM

## 2023-12-15 NOTE — Telephone Encounter (Signed)
-----   Message from Deborah [name] sent at 12/15/2023 10:03 AM EDT ----- Complete mole confirmed from pathology Weekly bhcg until normal ----- Message ----- From: Interface, Lab In [facility] Sent: 12/14/2023   1:14 PM EDT To: Deborah DELENA Buddle, MD

## 2023-12-15 NOTE — Telephone Encounter (Signed)
 Called patient. Patient verified name and DOB.  Patient is informed on her results and weekly beta until normal.  Scheduled lab appointment on 12/16/2023.  Ermalinda GRADE CMA

## 2023-12-16 ENCOUNTER — Other Ambulatory Visit: Payer: Self-pay

## 2023-12-16 ENCOUNTER — Other Ambulatory Visit

## 2023-12-16 DIAGNOSIS — O3680X0 Pregnancy with inconclusive fetal viability, not applicable or unspecified: Secondary | ICD-10-CM

## 2023-12-16 LAB — ANORA MISCARRIAGE TEST - FRESH

## 2023-12-17 ENCOUNTER — Other Ambulatory Visit

## 2023-12-17 LAB — BETA HCG QUANT (REF LAB): hCG Quant: 608 m[IU]/mL

## 2023-12-21 ENCOUNTER — Ambulatory Visit: Payer: Self-pay | Admitting: Obstetrics and Gynecology

## 2023-12-21 DIAGNOSIS — O3680X0 Pregnancy with inconclusive fetal viability, not applicable or unspecified: Secondary | ICD-10-CM

## 2023-12-22 NOTE — Telephone Encounter (Signed)
 Spoke to patient. Patient verified name and DOB.  Patient is aware of lab results and provider advise.  Scheduled tomorrow for Beta HCG lab.  Ermalinda GRADE CMA

## 2023-12-22 NOTE — Telephone Encounter (Signed)
-----   Message from Jerilynn DELENA Buddle sent at 12/21/2023  9:56 PM EDT ----- Bhcg 608, pt needs weekly labs until normal ----- Message ----- From: Rebecka Memos Lab Results In Sent: 12/17/2023   4:36 AM EDT To: Jerilynn DELENA Buddle, MD

## 2023-12-23 ENCOUNTER — Other Ambulatory Visit: Payer: Self-pay

## 2023-12-23 ENCOUNTER — Other Ambulatory Visit

## 2023-12-23 DIAGNOSIS — O3680X0 Pregnancy with inconclusive fetal viability, not applicable or unspecified: Secondary | ICD-10-CM

## 2023-12-24 ENCOUNTER — Ambulatory Visit: Payer: Self-pay | Admitting: Obstetrics and Gynecology

## 2023-12-24 LAB — BETA HCG QUANT (REF LAB): hCG Quant: 41 m[IU]/mL

## 2023-12-24 NOTE — Telephone Encounter (Addendum)
 RN called pt to inform her of down trending Beta Hcg results and provider recommendation of redraw in one week.  Pt verbalized understanding and is agreeable to blood draw at her post op appointment scheduled for 12/30/23 at 2:35 with Dr. Zina.  Waddell, RN   ----- Message from Rosaline Pendleton, RN sent at 12/24/2023 11:24 AM EDT ----- Attempted to call patient regarding physician's advised. Patient left VM to call us  back regarding non urgent results.   Rosaline, RN    ----- Message ----- From: Zina Jerilynn LABOR, MD Sent: 12/24/2023   8:27 AM EDT To: [facility]  Bhcg decreasing nicely, repeat in 1 week ----- Message ----- From: Rebecka Memos Lab Results In Sent: 12/24/2023   4:36 AM EDT To: Jerilynn LABOR Zina, MD

## 2023-12-24 NOTE — Telephone Encounter (Addendum)
 Attempted to call patient regarding physician's advised. Patient left VM to call us  back regarding non urgent results.  Rosaline, RN  ----- Message from Jerilynn DELENA Buddle sent at 12/24/2023  8:27 AM EDT ----- Bhcg decreasing nicely, repeat in 1 week ----- Message ----- From: Rebecka Memos Lab Results In Sent: 12/24/2023   4:36 AM EDT To: Jerilynn DELENA Buddle, MD

## 2023-12-30 ENCOUNTER — Ambulatory Visit (INDEPENDENT_AMBULATORY_CARE_PROVIDER_SITE_OTHER): Admitting: Obstetrics and Gynecology

## 2023-12-30 ENCOUNTER — Encounter: Payer: Self-pay | Admitting: Obstetrics and Gynecology

## 2023-12-30 ENCOUNTER — Other Ambulatory Visit: Payer: Self-pay

## 2023-12-30 VITALS — BP 150/110 | HR 72 | Wt 324.8 lb

## 2023-12-30 DIAGNOSIS — Z3009 Encounter for other general counseling and advice on contraception: Secondary | ICD-10-CM

## 2023-12-30 DIAGNOSIS — Z8759 Personal history of other complications of pregnancy, childbirth and the puerperium: Secondary | ICD-10-CM

## 2023-12-30 DIAGNOSIS — Z23 Encounter for immunization: Secondary | ICD-10-CM

## 2023-12-30 DIAGNOSIS — Z4889 Encounter for other specified surgical aftercare: Secondary | ICD-10-CM

## 2023-12-30 DIAGNOSIS — Z09 Encounter for follow-up examination after completed treatment for conditions other than malignant neoplasm: Secondary | ICD-10-CM

## 2023-12-30 NOTE — Progress Notes (Signed)
    Subjective:    Deborah [name] is a 28 y.o. female who presents to the clinic status post suction D and E on 12/11/23. The patient is not having any pain.  Eating a regular diet without difficulty. Bowel movements are normal. No other significant postoperative concerns.  The following portions of the patient's history were reviewed and updated as appropriate: allergies, current medications, past family history, past medical history, past social history, past surgical history, and problem list..  Last pap smear was normal on 05/19/22.  Review of Systems Pertinent items are noted in HPI.   Objective:   BP (!) 150/110   Pulse 72   Wt (!) 324 lb 12.8 oz (147.3 kg)   LMP 09/04/2023   BMI 50.87 kg/m  Constitutional:  Well-developed, well-nourished female in no acute distress.   Skin: Skin is warm and dry, no rash noted, not diaphoretic,no erythema, no pallor.  Cardiovascular: Normal heart rate noted  Respiratory: Effort and breath sounds normal, no problems with respiration noted  Abdomen: Soft, bowel sounds active, non-tender, no abnormal masses  Incision: N/a  Pelvic:   Deferred   Surgical pathology () POC are consistent with molar pregnancy Assessment:   Doing well postoperatively.  Operative findings again reviewed. Pathology report discussed.   Plan:   1. Continue any current medications. 2. Wound care discussed. 3. Activity restrictions: none 4. Anticipated return to work: now. 5. Follow up as needed 6.  Routine preventative health maintenance measures emphasized. 7. Discussed birth control method.  Initially considered combined OCP, but blood pressure was severely elevated.  Pt did not take anti hypertensive, but nexplanon or ID may still be a better option.  Pt will call or schedule with decision. 8.  Will check bhcg today and follow until normal Please refer to After Visit Summary for other counseling recommendations.    Jerilynn Buddle, MD, FACOG Attending  [facility], [facility]

## 2023-12-31 ENCOUNTER — Ambulatory Visit: Payer: Self-pay | Admitting: Obstetrics and Gynecology

## 2023-12-31 LAB — BETA HCG QUANT (REF LAB): hCG Quant: 9 m[IU]/mL

## 2024-01-11 NOTE — Addendum Note (Signed)
 Addended by: ELAINE ROSINA SAILOR on: 01/11/2024 04:17 PM   Modules accepted: Level of Service

## 2024-01-27 ENCOUNTER — Other Ambulatory Visit

## 2024-01-27 ENCOUNTER — Ambulatory Visit
